# Patient Record
Sex: Female | Born: 1945 | Race: White | Hispanic: No | Marital: Married | Smoking: Former smoker
Health system: Southern US, Community
[De-identification: ages and names within clinical notes are randomized; demographics above are authoritative.]

## PROBLEM LIST (undated history)

## (undated) DIAGNOSIS — E039 Hypothyroidism, unspecified: Secondary | ICD-10-CM

## (undated) DIAGNOSIS — M1990 Unspecified osteoarthritis, unspecified site: Secondary | ICD-10-CM

## (undated) DIAGNOSIS — Z9889 Other specified postprocedural states: Secondary | ICD-10-CM

## (undated) DIAGNOSIS — K219 Gastro-esophageal reflux disease without esophagitis: Secondary | ICD-10-CM

## (undated) DIAGNOSIS — D649 Anemia, unspecified: Secondary | ICD-10-CM

## (undated) DIAGNOSIS — R112 Nausea with vomiting, unspecified: Secondary | ICD-10-CM

## (undated) HISTORY — PX: KNEE ARTHROSCOPY: SUR90

## (undated) HISTORY — PX: JOINT REPLACEMENT: SHX530

---

## 2004-04-06 ENCOUNTER — Ambulatory Visit: Payer: Self-pay | Admitting: Internal Medicine

## 2005-12-30 ENCOUNTER — Ambulatory Visit: Payer: Self-pay | Admitting: Family Medicine

## 2006-01-03 ENCOUNTER — Ambulatory Visit: Payer: Self-pay | Admitting: Family Medicine

## 2006-01-07 ENCOUNTER — Ambulatory Visit: Payer: Self-pay | Admitting: Family Medicine

## 2006-01-12 ENCOUNTER — Ambulatory Visit: Payer: Self-pay | Admitting: Unknown Physician Specialty

## 2006-01-13 ENCOUNTER — Ambulatory Visit: Payer: Self-pay | Admitting: Family Medicine

## 2006-01-14 ENCOUNTER — Ambulatory Visit: Payer: Self-pay | Admitting: Family Medicine

## 2006-07-12 ENCOUNTER — Ambulatory Visit: Payer: Self-pay | Admitting: Family Medicine

## 2007-10-26 ENCOUNTER — Ambulatory Visit: Payer: Self-pay | Admitting: Family Medicine

## 2009-03-04 ENCOUNTER — Ambulatory Visit: Payer: Self-pay | Admitting: Family Medicine

## 2009-08-15 ENCOUNTER — Ambulatory Visit: Payer: Self-pay | Admitting: Unknown Physician Specialty

## 2009-10-15 ENCOUNTER — Encounter: Admission: RE | Admit: 2009-10-15 | Discharge: 2009-10-15 | Payer: Self-pay | Admitting: Sports Medicine

## 2010-05-08 ENCOUNTER — Encounter: Payer: Self-pay | Admitting: Family Medicine

## 2010-05-21 ENCOUNTER — Encounter: Payer: Self-pay | Admitting: Family Medicine

## 2010-07-24 ENCOUNTER — Other Ambulatory Visit (HOSPITAL_COMMUNITY): Payer: Self-pay | Admitting: Orthopedic Surgery

## 2010-07-24 ENCOUNTER — Encounter (HOSPITAL_COMMUNITY): Payer: 59

## 2010-07-24 ENCOUNTER — Ambulatory Visit (HOSPITAL_COMMUNITY)
Admission: RE | Admit: 2010-07-24 | Discharge: 2010-07-24 | Disposition: A | Discharge status: Home / Self Care | Payer: 59 | Source: Ambulatory Visit | Attending: Orthopedic Surgery | Admitting: Orthopedic Surgery

## 2010-07-24 ENCOUNTER — Other Ambulatory Visit: Payer: Self-pay | Admitting: Orthopedic Surgery

## 2010-07-24 DIAGNOSIS — Z01818 Encounter for other preprocedural examination: Secondary | ICD-10-CM | POA: Insufficient documentation

## 2010-07-24 DIAGNOSIS — IMO0002 Reserved for concepts with insufficient information to code with codable children: Secondary | ICD-10-CM | POA: Insufficient documentation

## 2010-07-24 DIAGNOSIS — M1710 Unilateral primary osteoarthritis, unspecified knee: Secondary | ICD-10-CM | POA: Insufficient documentation

## 2010-07-24 DIAGNOSIS — M1712 Unilateral primary osteoarthritis, left knee: Secondary | ICD-10-CM

## 2010-07-29 ENCOUNTER — Other Ambulatory Visit: Payer: Self-pay | Admitting: Orthopedic Surgery

## 2010-07-29 ENCOUNTER — Inpatient Hospital Stay (HOSPITAL_COMMUNITY)
Admission: RE | Admit: 2010-07-29 | Discharge: 2010-07-31 | DRG: 470 | Disposition: A | Discharge status: Skilled Nursing Facility | Payer: 59 | Source: Ambulatory Visit | Attending: Orthopedic Surgery | Admitting: Orthopedic Surgery

## 2010-07-29 ENCOUNTER — Inpatient Hospital Stay (HOSPITAL_COMMUNITY): Payer: 59

## 2010-07-29 DIAGNOSIS — D369 Benign neoplasm, unspecified site: Secondary | ICD-10-CM | POA: Diagnosis present

## 2010-07-29 DIAGNOSIS — M1710 Unilateral primary osteoarthritis, unspecified knee: Principal | ICD-10-CM | POA: Diagnosis present

## 2010-07-29 DIAGNOSIS — M659 Unspecified synovitis and tenosynovitis, unspecified site: Secondary | ICD-10-CM | POA: Diagnosis present

## 2010-07-29 DIAGNOSIS — E039 Hypothyroidism, unspecified: Secondary | ICD-10-CM | POA: Diagnosis present

## 2010-07-29 DIAGNOSIS — M810 Age-related osteoporosis without current pathological fracture: Secondary | ICD-10-CM | POA: Diagnosis present

## 2010-07-29 DIAGNOSIS — G43909 Migraine, unspecified, not intractable, without status migrainosus: Secondary | ICD-10-CM | POA: Diagnosis present

## 2010-07-29 LAB — ABO/RH: ABO/RH(D): A POS

## 2010-07-29 LAB — COMPREHENSIVE METABOLIC PANEL
AST: 17 U/L (ref 0–37)
Alkaline Phosphatase: 36 U/L — ABNORMAL LOW (ref 39–117)
Chloride: 106 mEq/L (ref 96–112)
GFR calc Af Amer: >60 mL/min (ref >60)
GFR calc non Af Amer: >60 mL/min (ref >60)
Potassium: 4.6 mEq/L (ref 3.5–5.1)
Total Bilirubin: 1.1 mg/dL (ref 0.3–1.2)
Total Protein: 6.1 g/dL (ref 6.0–8.3)

## 2010-07-29 LAB — CBC
HCT: 37.8 % (ref 36.0–46.0)
MCHC: 33.9 g/dL (ref 30.0–36.0)
MCV: 91.7 fL (ref 78.0–100.0)
Platelets: 304 10*3/uL (ref 150–400)

## 2010-07-29 LAB — PROTIME-INR: Prothrombin Time: 13 seconds (ref 11.6–15.2)

## 2010-07-29 LAB — APTT: aPTT: 26 seconds (ref 24–37)

## 2010-07-29 LAB — TYPE AND SCREEN: Antibody Screen: NEGATIVE

## 2010-07-29 LAB — SURGICAL PCR SCREEN: Staphylococcus aureus: NEGATIVE

## 2010-07-30 LAB — BASIC METABOLIC PANEL
BUN: 8 mg/dL (ref 6–23)
Calcium: 7.9 mg/dL — ABNORMAL LOW (ref 8.4–10.5)
GFR calc non Af Amer: >60 mL/min (ref >60)
Glucose, Bld: 108 mg/dL — ABNORMAL HIGH (ref 70–99)

## 2010-07-30 LAB — CBC
HCT: 30.6 % — ABNORMAL LOW (ref 36.0–46.0)
Hemoglobin: 10.3 g/dL — ABNORMAL LOW (ref 12.0–15.0)
MCHC: 33.7 g/dL (ref 30.0–36.0)
RBC: 3.32 MIL/uL — ABNORMAL LOW (ref 3.87–5.11)
RDW: 12.9 % (ref 11.5–15.5)

## 2010-07-31 LAB — CBC
Platelets: 207 10*3/uL (ref 150–400)
RBC: 3.28 MIL/uL — ABNORMAL LOW (ref 3.87–5.11)
WBC: 8.3 10*3/uL (ref 4.0–10.5)

## 2010-07-31 LAB — BASIC METABOLIC PANEL
BUN: 5 mg/dL — ABNORMAL LOW (ref 6–23)
CO2: 24 mEq/L (ref 19–32)
Calcium: 8.3 mg/dL — ABNORMAL LOW (ref 8.4–10.5)
Creatinine, Ser: 0.67 mg/dL (ref 0.4–1.2)
GFR calc Af Amer: >60 mL/min (ref >60)
GFR calc non Af Amer: >60 mL/min (ref >60)
Glucose, Bld: 105 mg/dL — ABNORMAL HIGH (ref 70–99)

## 2010-07-31 LAB — PROTIME-INR: Prothrombin Time: 16.9 seconds — ABNORMAL HIGH (ref 11.6–15.2)

## 2010-08-10 NOTE — Op Note (Signed)
NAME:  Valerie Gentry, Valerie Gentry                ACCOUNT NO.:  [id number]  MEDICAL RECORD NO.:  [id number]           PATIENT TYPE:  I  LOCATION:  5039                         FACILITY:  [facility]  PHYSICIAN:  Loreta Ave, M.D. DATE OF BIRTH:  05-Feb-1946  DATE OF PROCEDURE:  07/29/2010 DATE OF DISCHARGE:                              OPERATIVE REPORT   PREOPERATIVE DIAGNOSES: 1. Left knee end-stage degenerative arthritis, varus alignment. 2. Right knee end-stage degenerative arthritis, varus alignment.  POSTOPERATIVE DIAGNOSES: 1. Left knee end-stage degenerative arthritis, varus alignment. 2. Right knee end-stage degenerative arthritis, varus alignment.  PROCEDURE: 1. Left modified minimally invasive total knee replacement.  Soft     tissue balancing.  Stryker triathlon prosthesis.  Cemented pegged     posterior stabilized #5 femoral component.  Cemented #5 tibial     component with 11-mm polyethylene insert.  Cemented resurfacing     medial offset 38-mm patellar component. 2. Intra-articular injection Depo-Medrol and Marcaine, right knee.  SURGEON:  Loreta Ave, MD  ASSISTANT:  Genene Churn. Barry Dienes, PA-C, present throughout the entire case and necessary for timely completion of procedure.  ANESTHESIA:  General  BLOOD LOSS:  Minimal.  SPECIMEN:  None.  CULTURES:  None.  COMPLICATIONS:  None.  DRESSING:  Sterile compressive knee immobilizer.  DRAIN:  Hemovac x1.  TOURNIQUET TIME:  1 hour 10 minutes on the left.  PROCEDURE IN DETAIL:  The patient was brought to the operating room and after adequate anesthesia had been obtained, both knees examined.  She still has full extension, reasonable flexion, both knees.  Varus alignment correctable both sides.  Under sterile technique, intra- articular injection of right knee with Depo-Medrol and Marcaine. Attention was turned to the left.  Tourniquet applied.  Prepped and draped in usual sterile fashion.  Exsanguinated with elevation  of Esmarch, tourniquet inflated to 350 mmHg.  A longitudinal incision above the patella down to tibial tubercle.  Medial arthrotomy, vastus splitting, preserving quad tendon.  Knee exposed.  Grade 4 change throughout.  Remnants of menisci, cruciate ligaments.  Periarticular spurs removed.  Distal femur exposed.  Intramedullary guide placed. Distal cut 10 mm, set at 5 degrees of valgus.  Using epicondylar axis, the femur was sized, cut, and fitted for posterior stabilized pegged #5 femoral component.  Attention was turned to the tibia.  Extramedullary guide.  A 3-degree posterior slope cut.  Resected below the defect medially.  Size #5 component.  Recess examined to be sure all remnants of menisci spurs removed.  Patella exposed, posterior 10 mm removed. Drilled, sized, and fitted for 38-mm patellar component.  Trials put in place throughout.  #5 above and below.  An 11-mm insert and 38 of the patella.  With this construct, good mechanical axis, full extension, full flexion, good correction of deformity.  Excellent patellofemoral tracking.  Tibia was marked for rotation and hand reamed.  All trials have been removed.  Copious irrigation with a pulse irrigating device. Cement prepared and placed on all components, firmly seated. Polyethylene attached to tibia, knee reduced.  Patella held with clamp. Once the cement hardened, the knee was reexamined.  Again, very pleased with alignment, stability, and motion.  Hemovac had been placed through a separate stab wound superolaterally.  Wound irrigated once again. Arthrotomy closed with #1 Vicryl, skin and subcutaneous tissue with Vicryl and staples.  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  Portions of this was sent as a specimen to rule out inflammatory arthropathy.  As a result of that, degree of hypertrophic synovitis, we injected with  Depo-Medrol and Marcaine at completion and the Hemovac clamp.  Sterile compressive dressing applied.  Tourniquet was deflated and removed.  Knee immobilizer was applied.  Anesthesia was reversed.  Brought to the recovery room.  Tolerated the surgery well.  No complications.     Loreta Ave, M.D.     DFM/MEDQ  D:  07/30/2010  T:  07/31/2010  Job:  [id number]  Electronically Signed by Mckinley Jewel M.D. on 08/10/2010 02:58:57 PM

## 2010-08-10 NOTE — Discharge Summary (Signed)
NAME:  Valerie Gentry, Valerie Gentry                ACCOUNT NO.:  [id number]  MEDICAL RECORD NO.:  [id number]           PATIENT TYPE:  I  LOCATION:  5039                         FACILITY:  [facility]  PHYSICIAN:  Loreta Ave, M.D. DATE OF BIRTH:  Jan 24, 1946  DATE OF ADMISSION:  07/29/2010 DATE OF DISCHARGE:  07/31/2010                              DISCHARGE SUMMARY   FINAL DIAGNOSES: 1. Status post left total knee replacement for end-stage degenerative     joint disease. 2. Right knee intra-articular Marcaine/Depo-Medrol injection for     degenerative joint disease and synovitis 3. Migraines. 4. Hypothyroid. 5. Benign neoplasm. 6. Osteoporosis.  HISTORY OF PRESENT ILLNESS:  A 65 year old white female with history of end-stage DJD, left knee and chronic pain presented to our office for preop evaluation for total knee replacement.  She had progressive worsening pain with failure to response with conservative treatment. Significant decrease in her daily activities due to the ongoing complaint.  She has also had complaint of a right knee pain due to DJD and synovitis.  We had discussed performing a right knee intra-articular Marcaine/Depo-Medrol injection at the time of her surgery.  HOSPITAL COURSE:  On July 28, 2010, the patient was taken to the [facility] OR and a left total knee replacement and right knee intra- articular Marcaine/Depo-Medrol injection injections was performed.  SURGEON:  Loreta Ave, MD.  ASSISTANT:  Genene Churn. Barry Dienes, PA-C.  ANESTHESIA:  General.  Synovial tissue, left knee, sent for specimen.  BLOOD LOSS:  Minimal.  TOURNIQUET TIME:  70 minutes.  DRAIN:  One Hemovac drain used and the patient was given transfusion while in recovery.  There were no surgical or anesthesia complications and the patient was transferred to recovery in stable condition.  On July 30, 2010, the patient doing great without complaints. Pharmacy protocol, Coumadin and Lovenox  are for DVT prophylaxis.  PT, OT consults.  Temperature 98.8, pulse 87, respirations 18, blood pressure 113/64.  Dressing clean, dry, and intact.  Calf nontender. Neurovascularly intact.  Skin:  Warm and dry.  Awaiting transfer to [facility].  On July 31, 2010, the patient doing great and ready for transfer.  Temperature 98.2, pulse 96, respirations 18, blood pressure 130/97.  WBC 8.3, hemoglobin 10.1, hematocrit 30.1, platelets 207.  Sodium 136, potassium 3.7, chloride 106, CO2 24, BUN 5, creatinine 0.67, glucose 105, INR 1.35.  Knee wound looks good and staples intact. No drainage or sign of infection.  Hemovac drain removed.  Calf nontender.  Neurovascularly intact.  DISCHARGE MEDICATIONS: 1. Percocet 7.5/325 one-two tablets p.o. q. 4-6 hours p.r.n. for pain. 2. Robaxin 500 mg 1 tablet p.o. q.6 h p.r.n. for spasms. 3. Lovenox 30 mg one subcu injection q.12 h and discontinue when     Coumadin is therapeutic with INR 2-3. 4. Coumadin pharmacy protocol.  Maintain INR 2-3 x4 weeks postop for     DVT prophylaxis. 5. Cetirizine/Pseudoephedrine 1 tablet p.o. q.12 h p.r.n. 6. Synthroid 125 mcg 1 tablet p.o. daily. 7. Artificial tears both eyes 1 drop daily as needed.  DISPOSITION:  Transfer to [facility].  CONDITION:  Good  and stable.  INSTRUCTIONS:  While at the facility, the patient will work with PT/OT to improve ambulation and knee range of motion and strengthening. Weightbear as tolerated.  Can wean off walker to a single-point cane as comfortable.  CPM started at 0-75 degrees, then increase 10 degrees daily.  Daily dressing changes with 4x4 gauze and tape.  She is okay to shower, but no tub soaking.  Do not apply any creams or ointments to her incision.  Coumadin x4 weeks postop for DVT prophylaxis.  Maintain INR 2- 3.  Discontinue Lovenox when Coumadin is therapeutic.  Knee staples to be removed 2 weeks postop and this can be done at our office at 2-week postop  appointment.  Call with any questions, concerns.     Genene Churn. Denton Meek.   ______________________________ Loreta Ave, M.D.    JMO/MEDQ  D:  07/31/2010  T:  07/31/2010  Job:  [id number]  Electronically Signed by Zonia Kief P.A. on 08/07/2010 04:05:19 PM Electronically Signed by Mckinley Jewel M.D. on 08/10/2010 02:58:59 PM

## 2011-03-16 ENCOUNTER — Ambulatory Visit: Payer: Self-pay | Admitting: Family Medicine

## 2011-07-20 ENCOUNTER — Encounter (HOSPITAL_COMMUNITY): Payer: Self-pay | Admitting: Pharmacy Technician

## 2011-07-28 ENCOUNTER — Encounter (HOSPITAL_COMMUNITY)
Admission: RE | Admit: 2011-07-28 | Discharge: 2011-07-28 | Disposition: A | Discharge status: Home / Self Care | Payer: 59 | Source: Ambulatory Visit | Attending: Orthopedic Surgery | Admitting: Orthopedic Surgery

## 2011-07-28 ENCOUNTER — Encounter (HOSPITAL_COMMUNITY)
Admission: RE | Admit: 2011-07-28 | Discharge: 2011-07-28 | Disposition: A | Discharge status: Home / Self Care | Payer: 59 | Source: Ambulatory Visit | Attending: Surgery | Admitting: Surgery

## 2011-07-28 ENCOUNTER — Other Ambulatory Visit: Payer: Self-pay

## 2011-07-28 ENCOUNTER — Encounter (HOSPITAL_COMMUNITY): Payer: Self-pay

## 2011-07-28 HISTORY — DX: Unspecified osteoarthritis, unspecified site: M19.90

## 2011-07-28 HISTORY — DX: Hypothyroidism, unspecified: E03.9

## 2011-07-28 HISTORY — DX: Nausea with vomiting, unspecified: R11.2

## 2011-07-28 HISTORY — DX: Gastro-esophageal reflux disease without esophagitis: K21.9

## 2011-07-28 HISTORY — DX: Anemia, unspecified: D64.9

## 2011-07-28 HISTORY — DX: Other specified postprocedural states: Z98.890

## 2011-07-28 LAB — CBC
Hemoglobin: 12.5 g/dL (ref 12.0–15.0)
MCHC: 34.2 g/dL (ref 30.0–36.0)
RDW: 13 % (ref 11.5–15.5)
WBC: 6.1 10*3/uL (ref 4.0–10.5)

## 2011-07-28 LAB — COMPREHENSIVE METABOLIC PANEL
ALT: 11 U/L (ref 0–35)
Albumin: 3.7 g/dL (ref 3.5–5.2)
Alkaline Phosphatase: 42 U/L (ref 39–117)
Chloride: 105 mEq/L (ref 96–112)
Potassium: 4 mEq/L (ref 3.5–5.1)
Sodium: 139 mEq/L (ref 135–145)
Total Protein: 6.7 g/dL (ref 6.0–8.3)

## 2011-07-28 LAB — URINALYSIS, ROUTINE W REFLEX MICROSCOPIC
Glucose, UA: NEGATIVE mg/dL
Hgb urine dipstick: NEGATIVE
Specific Gravity, Urine: 1.021 (ref 1.005–1.030)
pH: 8 (ref 5.0–8.0)

## 2011-07-28 LAB — APTT: aPTT: 28 seconds (ref 24–37)

## 2011-07-28 LAB — SURGICAL PCR SCREEN
MRSA, PCR: NEGATIVE
Staphylococcus aureus: NEGATIVE

## 2011-07-28 NOTE — Pre-Procedure Instructions (Signed)
20 Valerie Gentry  07/28/2011   Your procedure is scheduled on: 08-04-2011 8:30 AM Report to [facility] at 6:30 AM.  Call this number if you have problems the morning of surgery: [phone]   Remember:   Do not eat food:After Midnight.  May have clear liquids: up to 4 Hours before arrival.  Clear liquids include soda, tea, black coffee, apple or grape juice, broth.Until 2:30 AM  Take these medicines the morning of surgery with A SIP OF WATER valium,levothyroxine,estradiol.tramadol  Do not wear jewelry, make-up or nail polish.  Do not wear lotions, powders, or perfumes. You may wear deodorant.  Do not shave 48 hours prior to surgery.  Do not bring valuables to the hospital.  Contacts, dentures or bridgework may not be worn into surgery.  Leave suitcase in the car. After surgery it may be brought to your room.  For patients admitted to the hospital, checkout time is 11:00 AM the day of discharge.   Marland Kitchen    Special Instructions: Incentive Spirometry - Practice and bring it with you on the day of surgery. and CHG Shower Use Special Wash: 1/2 bottle night before surgery and 1/2 bottle morning of surgery.   Please read over the following fact sheets that you were given: Blood Transfusion Information, MRSA Information and Surgical Site Infection Prevention

## 2011-07-28 NOTE — H&P (Signed)
  [facility] [address]   [address] [place], [place] [address] [phone] A [facility]  Loreta Ave, M.D.     Robert A. Thurston Hole, M.D.     Lunette Stands, M.D. Eulas Post, M.D.    Buford Dresser, M.D. Estell Harpin, M.D. Ralene Cork, D.O.          Genene Churn. Barry Dienes, PA-C            Kirstin A. Shepperson, PA-C Stevenson, OPA-C   RE: Valerie, Gentry   [id number]      DOB: 1945-12-28 PROGRESS NOTE: 07-23-11 Chief complaint: right knee pain.  History of present illness: 66 year old white female with history of end stage degenerative joint disease and chronic pain returns. Symptoms are unchanged from previous visit. She is wanting to proceed with total knee replacement as scheduled.  Current medications: Lunesta valium Synthroid Fem HRT Celebrex Tramadol simvatritine. Drug allergies: penicillin. Past medical/surgical history left total knee replacement osteoporosis benign bowel neoplasm anemia breast lump hemorrhoids migraines pleurisy hypothyroid dysphasia.  Family history: not listed. Social history: she's married employed with [employer]. Admits occasional alcohol use. Denies smoking. Review of systems: no fever chills GI GU cardiac or pulmonary issues.  EXAMINATION: Height 5'5" weight 200 pounds. Blood pressure 120/75 pulse 84. Alert and oriented x3 in no acute distress. Head normal cephalic atraumatic. PERRLA Extraocular motion is intact. Cervical spine unremarkable. Lungs CTA bilaterally no wheezes noted. Heart regular rate and rhythm. S1 S2. No murmurs. Abdomen round non-distended. NBSx4 soft non-tender. Right knee decreased range of motion. Positive crepitus. Joint line tenderness. 1 to 2+ effusion. Ligaments stable. Bilateral calves non-tender neurovascularly intact. Skin warm and dry.   IMPRESSION: Right knee end stage degenerative joint disease with chronic pain. Failed conservative  treatment.  DISPOSITION: We'll proceed with right total knee replacement as scheduled. Discussed risks benefits and possible complications in detail. All questions answered.  Loreta Ave, M.D.  Electronically verified by Loreta Ave, M.D. DFM(JMO):kh D 07-26-11 T 07-26-11

## 2011-07-28 NOTE — Progress Notes (Signed)
Pt. 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

## 2011-08-03 MED ORDER — VANCOMYCIN HCL IN DEXTROSE 1-5 GM/200ML-% IV SOLN
1000.0000 mg | INTRAVENOUS | Status: AC
  Administered 2011-08-04: 1000 mg via INTRAVENOUS
  Filled 2011-08-03: qty 200

## 2011-08-04 ENCOUNTER — Encounter (HOSPITAL_COMMUNITY): Payer: Self-pay | Admitting: Certified Registered"

## 2011-08-04 ENCOUNTER — Encounter (HOSPITAL_COMMUNITY): Payer: Self-pay | Admitting: *Deleted

## 2011-08-04 ENCOUNTER — Ambulatory Visit (HOSPITAL_COMMUNITY): Payer: 59 | Admitting: Certified Registered"

## 2011-08-04 ENCOUNTER — Ambulatory Visit (HOSPITAL_COMMUNITY): Payer: 59

## 2011-08-04 ENCOUNTER — Inpatient Hospital Stay (HOSPITAL_COMMUNITY)
Admission: RE | Admit: 2011-08-04 | Discharge: 2011-08-07 | DRG: 470 | Disposition: A | Discharge status: Skilled Nursing Facility | Payer: 59 | Source: Ambulatory Visit | Attending: Orthopedic Surgery | Admitting: Orthopedic Surgery

## 2011-08-04 ENCOUNTER — Encounter (HOSPITAL_COMMUNITY)
Admission: RE | Disposition: A | Discharge status: Skilled Nursing Facility | Payer: Self-pay | Source: Ambulatory Visit | Attending: Orthopedic Surgery

## 2011-08-04 DIAGNOSIS — Z01810 Encounter for preprocedural cardiovascular examination: Secondary | ICD-10-CM

## 2011-08-04 DIAGNOSIS — M1710 Unilateral primary osteoarthritis, unspecified knee: Principal | ICD-10-CM | POA: Diagnosis present

## 2011-08-04 DIAGNOSIS — G8929 Other chronic pain: Secondary | ICD-10-CM | POA: Diagnosis present

## 2011-08-04 DIAGNOSIS — Z01818 Encounter for other preprocedural examination: Secondary | ICD-10-CM

## 2011-08-04 DIAGNOSIS — Z01812 Encounter for preprocedural laboratory examination: Secondary | ICD-10-CM

## 2011-08-04 DIAGNOSIS — Z7901 Long term (current) use of anticoagulants: Secondary | ICD-10-CM

## 2011-08-04 DIAGNOSIS — Z471 Aftercare following joint replacement surgery: Secondary | ICD-10-CM

## 2011-08-04 DIAGNOSIS — M810 Age-related osteoporosis without current pathological fracture: Secondary | ICD-10-CM | POA: Diagnosis present

## 2011-08-04 DIAGNOSIS — D62 Acute posthemorrhagic anemia: Secondary | ICD-10-CM | POA: Diagnosis not present

## 2011-08-04 DIAGNOSIS — Z880 Allergy status to penicillin: Secondary | ICD-10-CM

## 2011-08-04 DIAGNOSIS — E039 Hypothyroidism, unspecified: Secondary | ICD-10-CM | POA: Diagnosis present

## 2011-08-04 DIAGNOSIS — G43909 Migraine, unspecified, not intractable, without status migrainosus: Secondary | ICD-10-CM | POA: Diagnosis present

## 2011-08-04 DIAGNOSIS — Z79899 Other long term (current) drug therapy: Secondary | ICD-10-CM

## 2011-08-04 HISTORY — PX: TOTAL KNEE ARTHROPLASTY: SHX125

## 2011-08-04 LAB — TYPE AND SCREEN
ABO/RH(D): A POS
Antibody Screen: NEGATIVE

## 2011-08-04 SURGERY — ARTHROPLASTY, KNEE, TOTAL
Anesthesia: Regional | Laterality: Right | Wound class: Clean

## 2011-08-04 MED ORDER — SODIUM CHLORIDE 0.9 % IR SOLN
Status: DC | PRN
  Administered 2011-08-04: 3000 mL

## 2011-08-04 MED ORDER — POTASSIUM CHLORIDE IN NACL 20-0.9 MEQ/L-% IV SOLN
INTRAVENOUS | Status: DC
  Administered 2011-08-04: 18:00:00 via INTRAVENOUS
  Filled 2011-08-04 (×4): qty 1000

## 2011-08-04 MED ORDER — ENOXAPARIN SODIUM 30 MG/0.3ML ~~LOC~~ SOLN
30.0000 mg | Freq: Two times a day (BID) | SUBCUTANEOUS | Status: DC
  Administered 2011-08-04 – 2011-08-07 (×6): 30 mg via SUBCUTANEOUS
  Filled 2011-08-04 (×7): qty 0.3

## 2011-08-04 MED ORDER — ONDANSETRON HCL 4 MG/2ML IJ SOLN: 4.0000 mg | Freq: Four times a day (QID) | INTRAMUSCULAR | Status: DC | PRN

## 2011-08-04 MED ORDER — DEXAMETHASONE SODIUM PHOSPHATE 4 MG/ML IJ SOLN
INTRAMUSCULAR | Status: DC | PRN
  Administered 2011-08-04: 4 mg via INTRAVENOUS

## 2011-08-04 MED ORDER — DEXTROSE 5 % IV SOLN
500.0000 mg | Freq: Four times a day (QID) | INTRAVENOUS | Status: DC | PRN
  Filled 2011-08-04: qty 5

## 2011-08-04 MED ORDER — MORPHINE SULFATE 4 MG/ML IJ SOLN
INTRAMUSCULAR | Status: DC | PRN
  Administered 2011-08-04: 4 mg via INTRAVENOUS

## 2011-08-04 MED ORDER — HYDROMORPHONE HCL PF 1 MG/ML IJ SOLN
0.2500 mg | INTRAMUSCULAR | Status: DC | PRN
  Administered 2011-08-04 (×4): 0.5 mg via INTRAVENOUS

## 2011-08-04 MED ORDER — DEXTROSE 5 % IV SOLN
INTRAVENOUS | Status: DC | PRN
  Administered 2011-08-04: 09:00:00 via INTRAVENOUS

## 2011-08-04 MED ORDER — DROPERIDOL 2.5 MG/ML IJ SOLN
INTRAMUSCULAR | Status: DC | PRN
  Administered 2011-08-04: 0.625 mg via INTRAVENOUS

## 2011-08-04 MED ORDER — ACETAMINOPHEN 325 MG PO TABS: 650.0000 mg | ORAL_TABLET | Freq: Four times a day (QID) | ORAL | Status: DC | PRN

## 2011-08-04 MED ORDER — LIDOCAINE HCL 4 % MT SOLN
OROMUCOSAL | Status: DC | PRN
  Administered 2011-08-04: 4 mL via TOPICAL

## 2011-08-04 MED ORDER — COUMADIN BOOK
Freq: Once | Status: AC
  Administered 2011-08-04: 16:00:00
  Filled 2011-08-04: qty 1

## 2011-08-04 MED ORDER — MIDAZOLAM HCL 5 MG/5ML IJ SOLN
INTRAMUSCULAR | Status: DC | PRN
  Administered 2011-08-04: 2 mg via INTRAVENOUS

## 2011-08-04 MED ORDER — VANCOMYCIN HCL IN DEXTROSE 1-5 GM/200ML-% IV SOLN
1000.0000 mg | Freq: Two times a day (BID) | INTRAVENOUS | Status: AC
  Administered 2011-08-04 – 2011-08-05 (×2): 1000 mg via INTRAVENOUS
  Filled 2011-08-04 (×2): qty 200

## 2011-08-04 MED ORDER — OXYCODONE-ACETAMINOPHEN 5-325 MG PO TABS
1.0000 | ORAL_TABLET | ORAL | Status: DC | PRN
  Administered 2011-08-05: 2 via ORAL
  Administered 2011-08-05 (×3): 1 via ORAL
  Administered 2011-08-05 – 2011-08-06 (×2): 2 via ORAL
  Administered 2011-08-06: 1 via ORAL
  Administered 2011-08-06 – 2011-08-07 (×4): 2 via ORAL
  Filled 2011-08-04: qty 1
  Filled 2011-08-04 (×3): qty 2
  Filled 2011-08-04 (×2): qty 1
  Filled 2011-08-04 (×4): qty 2
  Filled 2011-08-04: qty 1

## 2011-08-04 MED ORDER — DIPHENHYDRAMINE HCL 12.5 MG/5ML PO ELIX
12.5000 mg | ORAL_SOLUTION | Freq: Four times a day (QID) | ORAL | Status: DC | PRN
  Filled 2011-08-04: qty 5

## 2011-08-04 MED ORDER — PROPOFOL 10 MG/ML IV EMUL
INTRAVENOUS | Status: DC | PRN
  Administered 2011-08-04: 150 mg via INTRAVENOUS

## 2011-08-04 MED ORDER — MORPHINE SULFATE (PF) 1 MG/ML IV SOLN
INTRAVENOUS | Status: DC
  Administered 2011-08-04: 1 mg via INTRAVENOUS
  Administered 2011-08-05: 4 mg via INTRAVENOUS

## 2011-08-04 MED ORDER — WARFARIN VIDEO: Freq: Once | Status: DC

## 2011-08-04 MED ORDER — GLYCOPYRROLATE 0.2 MG/ML IJ SOLN
INTRAMUSCULAR | Status: DC | PRN
  Administered 2011-08-04: .4 mg via INTRAVENOUS

## 2011-08-04 MED ORDER — BUPIVACAINE HCL (PF) 0.25 % IJ SOLN
INTRAMUSCULAR | Status: DC | PRN
  Administered 2011-08-04: 30 mL

## 2011-08-04 MED ORDER — FLEET ENEMA 7-19 GM/118ML RE ENEM: 1.0000 | ENEMA | Freq: Once | RECTAL | Status: AC | PRN

## 2011-08-04 MED ORDER — SUFENTANIL CITRATE 50 MCG/ML IV SOLN
INTRAVENOUS | Status: DC | PRN
  Administered 2011-08-04 (×5): 10 ug via INTRAVENOUS

## 2011-08-04 MED ORDER — LACTATED RINGERS IV SOLN
INTRAVENOUS | Status: DC | PRN
  Administered 2011-08-04 (×2): via INTRAVENOUS

## 2011-08-04 MED ORDER — ROCURONIUM BROMIDE 100 MG/10ML IV SOLN
INTRAVENOUS | Status: DC | PRN
  Administered 2011-08-04: 50 mg via INTRAVENOUS

## 2011-08-04 MED ORDER — DOCUSATE SODIUM 100 MG PO CAPS
100.0000 mg | ORAL_CAPSULE | Freq: Two times a day (BID) | ORAL | Status: DC
  Administered 2011-08-04 – 2011-08-07 (×6): 100 mg via ORAL
  Filled 2011-08-04 (×7): qty 1

## 2011-08-04 MED ORDER — NALOXONE HCL 0.4 MG/ML IJ SOLN: 0.4000 mg | INTRAMUSCULAR | Status: DC | PRN

## 2011-08-04 MED ORDER — METHOCARBAMOL 500 MG PO TABS
500.0000 mg | ORAL_TABLET | Freq: Four times a day (QID) | ORAL | Status: DC | PRN
  Administered 2011-08-05 – 2011-08-07 (×3): 500 mg via ORAL
  Filled 2011-08-04 (×3): qty 1

## 2011-08-04 MED ORDER — ACETAMINOPHEN 650 MG RE SUPP: 650.0000 mg | Freq: Four times a day (QID) | RECTAL | Status: DC | PRN

## 2011-08-04 MED ORDER — SCOPOLAMINE 1 MG/3DAYS TD PT72
MEDICATED_PATCH | TRANSDERMAL | Status: DC | PRN
  Administered 2011-08-04: 1 via TRANSDERMAL

## 2011-08-04 MED ORDER — ONDANSETRON HCL 4 MG PO TABS
4.0000 mg | ORAL_TABLET | Freq: Four times a day (QID) | ORAL | Status: DC | PRN
  Administered 2011-08-07: 4 mg via ORAL
  Filled 2011-08-04: qty 1

## 2011-08-04 MED ORDER — SODIUM CHLORIDE 0.9 % IJ SOLN: 9.0000 mL | INTRAMUSCULAR | Status: DC | PRN

## 2011-08-04 MED ORDER — PHENOL 1.4 % MT LIQD
1.0000 | OROMUCOSAL | Status: DC | PRN
  Filled 2011-08-04: qty 177

## 2011-08-04 MED ORDER — WARFARIN SODIUM 7.5 MG PO TABS
7.5000 mg | ORAL_TABLET | Freq: Once | ORAL | Status: AC
  Administered 2011-08-04: 7.5 mg via ORAL
  Filled 2011-08-04: qty 1

## 2011-08-04 MED ORDER — DIAZEPAM 5 MG PO TABS: 5.0000 mg | ORAL_TABLET | Freq: Two times a day (BID) | ORAL | Status: DC | PRN

## 2011-08-04 MED ORDER — NEOSTIGMINE METHYLSULFATE 1 MG/ML IJ SOLN
INTRAMUSCULAR | Status: DC | PRN
  Administered 2011-08-04: 3 mg via INTRAVENOUS

## 2011-08-04 MED ORDER — NORETHINDRONE-ETH ESTRADIOL 1-5 MG-MCG PO TABS
1.0000 | ORAL_TABLET | Freq: Every day | ORAL | Status: DC
  Administered 2011-08-05: 1 via ORAL

## 2011-08-04 MED ORDER — DIPHENHYDRAMINE HCL 50 MG/ML IJ SOLN: 12.5000 mg | Freq: Four times a day (QID) | INTRAMUSCULAR | Status: DC | PRN

## 2011-08-04 MED ORDER — LEVOTHYROXINE SODIUM 125 MCG PO TABS
125.0000 ug | ORAL_TABLET | Freq: Every day | ORAL | Status: DC
  Administered 2011-08-05 – 2011-08-07 (×3): 125 ug via ORAL
  Filled 2011-08-04 (×4): qty 1

## 2011-08-04 MED ORDER — SUMATRIPTAN SUCCINATE 50 MG PO TABS
50.0000 mg | ORAL_TABLET | ORAL | Status: DC | PRN
  Filled 2011-08-04: qty 1

## 2011-08-04 MED ORDER — MENTHOL 3 MG MT LOZG: 1.0000 | LOZENGE | OROMUCOSAL | Status: DC | PRN

## 2011-08-04 MED ORDER — ONDANSETRON HCL 4 MG/2ML IJ SOLN
INTRAMUSCULAR | Status: DC | PRN
  Administered 2011-08-04: 4 mg via INTRAVENOUS

## 2011-08-04 SURGICAL SUPPLY — 55 items
BANDAGE ESMARK 6X9 LF (GAUZE/BANDAGES/DRESSINGS) ×1 IMPLANT
BLADE SAG 18X100X1.27 (BLADE) ×4 IMPLANT
BNDG ESMARK 6X9 LF (GAUZE/BANDAGES/DRESSINGS) ×2
BOOTCOVER CLEANROOM LRG (PROTECTIVE WEAR) ×4 IMPLANT
BOWL SMART MIX CTS (DISPOSABLE) ×2 IMPLANT
CEMENT BONE SIMPLEX SPEEDSET (Cement) ×4 IMPLANT
CLOTH BEACON ORANGE TIMEOUT ST (SAFETY) ×2 IMPLANT
COVER BACK TABLE 24X17X13 BIG (DRAPES) ×2 IMPLANT
COVER SURGICAL LIGHT HANDLE (MISCELLANEOUS) ×2 IMPLANT
CUFF TOURNIQUET SINGLE 34IN LL (TOURNIQUET CUFF) ×2 IMPLANT
DRAPE EXTREMITY T 121X128X90 (DRAPE) ×2 IMPLANT
DRAPE PROXIMA HALF (DRAPES) ×2 IMPLANT
DRAPE U-SHAPE 47X51 STRL (DRAPES) ×2 IMPLANT
DRSG PAD ABDOMINAL 8X10 ST (GAUZE/BANDAGES/DRESSINGS) ×2 IMPLANT
DURAPREP 26ML APPLICATOR (WOUND CARE) ×2 IMPLANT
ELECT CAUTERY BLADE 6.4 (BLADE) ×2 IMPLANT
ELECT REM PT RETURN 9FT ADLT (ELECTROSURGICAL) ×2
ELECTRODE REM PT RTRN 9FT ADLT (ELECTROSURGICAL) ×1 IMPLANT
EVACUATOR 1/8 PVC DRAIN (DRAIN) ×2 IMPLANT
FACESHIELD LNG OPTICON STERILE (SAFETY) ×2 IMPLANT
GAUZE SPONGE 4X4 12PLY STRL LF (GAUZE/BANDAGES/DRESSINGS) ×2 IMPLANT
GAUZE XEROFORM 5X9 LF (GAUZE/BANDAGES/DRESSINGS) ×2 IMPLANT
GLOVE BIOGEL PI IND STRL 8 (GLOVE) ×1 IMPLANT
GLOVE BIOGEL PI INDICATOR 8 (GLOVE) ×1
GOWN PREVENTION PLUS XLARGE (GOWN DISPOSABLE) ×4 IMPLANT
GOWN STRL NON-REIN LRG LVL3 (GOWN DISPOSABLE) ×4 IMPLANT
GOWN STRL REIN 2XL XLG LVL4 (GOWN DISPOSABLE) ×2 IMPLANT
HANDPIECE INTERPULSE COAX TIP (DISPOSABLE) ×1
IMMOBILIZER KNEE 22 UNIV (SOFTGOODS) ×2 IMPLANT
IMMOBILIZER KNEE 24 THIGH 36 (MISCELLANEOUS) IMPLANT
IMMOBILIZER KNEE 24 UNIV (MISCELLANEOUS)
KIT BASIN OR (CUSTOM PROCEDURE TRAY) ×2 IMPLANT
KIT ROOM TURNOVER OR (KITS) ×2 IMPLANT
MANIFOLD NEPTUNE II (INSTRUMENTS) ×2 IMPLANT
NS IRRIG 1000ML POUR BTL (IV SOLUTION) ×2 IMPLANT
PACK TOTAL JOINT (CUSTOM PROCEDURE TRAY) ×2 IMPLANT
PAD ARMBOARD 7.5X6 YLW CONV (MISCELLANEOUS) ×4 IMPLANT
PAD CAST 4YDX4 CTTN HI CHSV (CAST SUPPLIES) ×1 IMPLANT
PADDING CAST COTTON 4X4 STRL (CAST SUPPLIES) ×1
PADDING CAST COTTON 6X4 STRL (CAST SUPPLIES) ×2 IMPLANT
RUBBERBAND STERILE (MISCELLANEOUS) ×2 IMPLANT
SET HNDPC FAN SPRY TIP SCT (DISPOSABLE) ×1 IMPLANT
SPONGE GAUZE 4X4 12PLY (GAUZE/BANDAGES/DRESSINGS) ×2 IMPLANT
STAPLER VISISTAT 35W (STAPLE) ×2 IMPLANT
SUCTION FRAZIER TIP 10 FR DISP (SUCTIONS) ×2 IMPLANT
SUT VIC AB 1 CTX 36 (SUTURE) ×2
SUT VIC AB 1 CTX36XBRD ANBCTR (SUTURE) ×2 IMPLANT
SUT VIC AB 2-0 CT1 27 (SUTURE) ×2
SUT VIC AB 2-0 CT1 TAPERPNT 27 (SUTURE) ×2 IMPLANT
SYR 30ML LL (SYRINGE) ×2 IMPLANT
SYR 30ML SLIP (SYRINGE) ×2 IMPLANT
TOWEL OR 17X24 6PK STRL BLUE (TOWEL DISPOSABLE) ×2 IMPLANT
TOWEL OR 17X26 10 PK STRL BLUE (TOWEL DISPOSABLE) ×2 IMPLANT
TRAY FOLEY CATH 14FR (SET/KITS/TRAYS/PACK) ×2 IMPLANT
WATER STERILE IRR 1000ML POUR (IV SOLUTION) ×6 IMPLANT

## 2011-08-04 NOTE — Brief Op Note (Signed)
08/04/2011  10:46 AM  PATIENT:  Valerie Gentry  66 y.o. female  PRE-OPERATIVE DIAGNOSIS:  DJD RIGHT KNEE  POST-OPERATIVE DIAGNOSIS:  DJD RIGHT KNEE  PROCEDURE:  Procedure(s) (LRB): TOTAL KNEE ARTHROPLASTY (Right)  SURGEON:  Surgeon(s) and Role:    * Loreta Ave, MD - Primary  PHYSICIAN ASSISTANT: Zonia Kief M   ANESTHESIA:   general  EBL:  Total I/O In: 1500 [I.V.:1500] Out: 100 [Urine:100]   SPECIMEN:  No Specimen  DISPOSITION OF SPECIMEN:  N/A  TOURNIQUET:   Total Tourniquet Time Documented: Thigh (Right) - 81 minutes  PATIENT DISPOSITION:  PACU - hemodynamically stable.

## 2011-08-04 NOTE — Anesthesia Procedure Notes (Signed)
Anesthesia Regional Block:  Femoral nerve block  Pre-Anesthetic Checklist: ,, timeout performed, Correct Patient, Correct Site, Correct Laterality, Correct Procedure,, site marked, risks and benefits discussed, Surgical consent,  Pre-op evaluation,  At surgeon's request and post-op pain management  Laterality: Right  Prep: chloraprep       Needles:  Injection technique: Single-shot  Needle Type: Echogenic Stimulator Needle     Needle Length: 5cm 5 cm Needle Gauge: 22 and 22 G    Additional Needles:  Procedures: ultrasound guided and nerve stimulator Femoral nerve block  Nerve Stimulator or Paresthesia:  Response: quadraceps contraction, 0.45 mA,   Additional Responses:   Narrative:  Start time: 08/04/2011 7:53 AM End time: 08/04/2011 8:06 AM Injection made incrementally with aspirations every 5 mL.  Performed by: Personally  Anesthesiologist: Halford Decamp, MD  Additional Notes: Functioning IV was confirmed and monitors were applied.  A 50mm 22ga Arrow echogenic stimulator needle was used. Sterile prep and drape,hand hygiene and sterile gloves were used. Ultrasound guidance: relevent anatomy identified, needle position confirmed, local anesthetic spread visualized around nerve(s)., vascular puncture avoided.  Image printed for medical record. Negative aspiration and negative test dose prior to incremental administration of local anesthetic. The patient tolerated the procedure well.    Femoral nerve block

## 2011-08-04 NOTE — Transfer of Care (Signed)
Immediate Anesthesia Transfer of Care Note  Patient: Valerie Gentry  Procedure(s) Performed: Procedure(s) (LRB): TOTAL KNEE ARTHROPLASTY (Right)  Patient Location: PACU  Anesthesia Type: General and GA combined with regional for post-op pain  Level of Consciousness: awake, alert , oriented, patient cooperative and responds to stimulation  Airway & Oxygen Therapy: Patient Spontanous Breathing and Patient connected to nasal cannula oxygen  Post-op Assessment: Report given to PACU RN, Post -op Vital signs reviewed and stable and Patient moving all extremities  Post vital signs: Reviewed and stable  Complications: No apparent anesthesia complications

## 2011-08-04 NOTE — Progress Notes (Signed)
Orthopedic Tech Progress Note Patient Details:  Valerie Gentry 1945-12-14 [id number]  CPM Right Knee CPM Right Knee: On Right Knee Flexion (Degrees): 0  Right Knee Extension (Degrees): 60    applied overhead frame    Jennye Moccasin 08/04/2011, 4:49 PM

## 2011-08-04 NOTE — Anesthesia Preprocedure Evaluation (Addendum)
Anesthesia Evaluation  Patient identified by MRN, date of birth, ID band Patient awake    Reviewed: Allergy & Precautions, H&P , NPO status , Patient's Chart, lab work & pertinent test results  History of Anesthesia Complications (+) PONV  Airway Mallampati: II  Neck ROM: full    Dental   Pulmonary former smoker clear to auscultation  Pulmonary exam normal       Cardiovascular Regular Normal    Neuro/Psych    GI/Hepatic GERD-  ,  Endo/Other  Hypothyroidism   Renal/GU      Musculoskeletal  (+) Arthritis -,   Abdominal   Peds  Hematology   Anesthesia Other Findings   Reproductive/Obstetrics                          Anesthesia Physical Anesthesia Plan  ASA: II  Anesthesia Plan: General and Regional   Post-op Pain Management: MAC Combined w/ Regional for Post-op pain   Induction: Intravenous  Airway Management Planned: Oral ETT  Additional Equipment:   Intra-op Plan:   Post-operative Plan: Extubation in OR  Informed Consent: I have reviewed the patients History and Physical, chart, labs and discussed the procedure including the risks, benefits and alternatives for the proposed anesthesia with the patient or authorized representative who has indicated his/her understanding and acceptance.     Plan Discussed with: CRNA and Surgeon  Anesthesia Plan Comments:         Anesthesia Quick Evaluation

## 2011-08-04 NOTE — Progress Notes (Signed)
ANTICOAGULATION CONSULT NOTE - Initial Consult  Pharmacy Consult for Coumadin Indication: VTE prophylaxis  Allergies  Allergen Reactions  . Ampicillin Rash    Patient Measurements: Height: 5\' 5"  (165.1 cm) (from 07/27/11 preadmit) Weight: 199 lb 4.7 oz (90.399 kg) (from 07/28/11 preadmit) IBW/kg (Calculated) : 57    Vital Signs: Temp: 99.3 F (37.4 C) (02/13 1333) Temp src: Oral (02/13 0652) BP: 114/68 mmHg (02/13 1329) Pulse Rate: 68  (02/13 1330)  Preadmisson labs 07/28/11 PTT 28, PT 13.4, INR 1.00 Hgb 12.5,  Hct 36.5, PLTC 322 WBC 6.1 Scr 0.69 No results found for this basename: HGB:2,HCT:3,PLT:3,APTT:3,LABPROT:3,INR:3,HEPARINUNFRC:3,CREATININE:3,CKTOTAL:3,CKMB:3,TROPONINI:3 in the last 72 hours Estimated Creatinine Clearance: 80 ml/min (by C-G formula based on Cr of 0.69).  Medical History: Past Medical History  Diagnosis Date  . PONV (postoperative nausea and vomiting)   . Hypothyroidism   . GERD (gastroesophageal reflux disease)   . Arthritis   . Anemia     Medications:  Prescriptions prior to admission  Medication Sig Dispense Refill  . celecoxib (CELEBREX) 200 MG capsule Take 200 mg by mouth 2 (two) times daily.      . Cholecalciferol (VITAMIN D-3) 5000 UNITS TABS Take 1 tablet by mouth daily.      . diazepam (VALIUM) 5 MG tablet Take 5 mg by mouth every 12 (twelve) hours as needed. For anxiety      . Eszopiclone (ESZOPICLONE) 3 MG TABS Take 3 mg by mouth at bedtime as needed. Take immediately before bedtime for sleep      . levothyroxine (SYNTHROID, LEVOTHROID) 125 MCG tablet Take 125 mcg by mouth daily.      . norethindrone-ethinyl estradiol (FEMHRT 1/5) 1-5 MG-MCG TABS Take 1 tablet by mouth daily.      . SUMAtriptan (IMITREX) 50 MG tablet Take 50 mg by mouth every 2 (two) hours as needed. For migrane headache      . traMADol (ULTRAM) 50 MG tablet Take 100 mg by mouth every 8 (eight) hours as needed. For pain       Scheduled:    . docusate sodium  100 mg  Oral BID  . enoxaparin  30 mg Subcutaneous Q12H  . levothyroxine  125 mcg Oral Daily  . morphine   Intravenous Q4H  . norethindrone-ethinyl estradiol  1 tablet Oral Daily  . vancomycin  1,000 mg Intravenous 60 min Pre-Op  . vancomycin  1,000 mg Intravenous Q12H   Infusions:    . 0.9 % NaCl with KCl 20 mEq / L     PRN: acetaminophen, acetaminophen, diazepam, diphenhydrAMINE, diphenhydrAMINE, menthol-cetylpyridinium, methocarbamol(ROBAXIN) IV, methocarbamol, naloxone, ondansetron (ZOFRAN) IV, ondansetron, oxyCODONE-acetaminophen, phenol, sodium chloride, sodium phosphate, SUMAtriptan, DISCONTD: bupivacaine, DISCONTD: HYDROmorphone, DISCONTD: morphine, DISCONTD: ondansetron (ZOFRAN) IV, DISCONTD: ondansetron (ZOFRAN) IV, DISCONTD: sodium chloride irrigation Anti-infectives     Start     Dose/Rate Route Frequency Ordered Stop   08/04/11 2100   vancomycin (VANCOCIN) IVPB 1000 mg/200 mL premix        1,000 mg 200 mL/hr over 60 Minutes Intravenous Every 12 hours 08/04/11 1441 08/05/11 2059   08/04/11 0500   vancomycin (VANCOCIN) IVPB 1000 mg/200 mL premix        1,000 mg 200 mL/hr over 60 Minutes Intravenous 60 min pre-op 08/03/11 1514 08/04/11 0852          Assessment: 66 yo female s/p R. TKA to start on Coumadin for VTE prophylaxis and bridge with Lovenox 30mg  sq q12h. Renal function appropriate for this lovenox dose based on SCr 0.69 on  07/28/11. Baseline INR =1.0, H/H 12.5/36.5, pltc 322.  Goal of Therapy:  INR 2-3   Plan:  Coumadin 7.5mg  po x1 INR daily.  Coumadin education book and video ordered.   Arman Filter 08/04/2011,3:00 PM

## 2011-08-04 NOTE — Interval H&P Note (Signed)
History and Physical Interval Note:  08/04/2011 8:36 AM  Valerie Gentry  has presented today for surgery, with the diagnosis of DJD RIGHT KNEE  The various methods of treatment have been discussed with the patient and family. After consideration of risks, benefits and other options for treatment, the patient has consented to  Procedure(s) (LRB): TOTAL KNEE ARTHROPLASTY (Right) as a surgical intervention .  The patients' history has been reviewed, patient examined, no change in status, stable for surgery.  I have reviewed the patients' chart and labs.  Questions were answered to the patient's satisfaction.     Merrit Waugh F

## 2011-08-04 NOTE — Anesthesia Postprocedure Evaluation (Signed)
Anesthesia Post Note  Patient: Valerie Gentry  Procedure(s) Performed: Procedure(s) (LRB): TOTAL KNEE ARTHROPLASTY (Right)  Anesthesia type: General  Patient location: PACU  Post pain: Pain level controlled and Adequate analgesia  Post assessment: Post-op Vital signs reviewed, Patient's Cardiovascular Status Stable, Respiratory Function Stable, Patent Airway and Pain level controlled  Last Vitals:  Filed Vitals:   08/04/11 1200  BP:   Pulse: 59  Temp:   Resp: 13    Post vital signs: Reviewed and stable  Level of consciousness: awake, alert  and oriented  Complications: No apparent anesthesia complications

## 2011-08-05 ENCOUNTER — Encounter (HOSPITAL_COMMUNITY): Payer: Self-pay | Admitting: Orthopedic Surgery

## 2011-08-05 LAB — BASIC METABOLIC PANEL
BUN: 13 mg/dL (ref 6–23)
CO2: 24 mEq/L (ref 19–32)
Chloride: 107 mEq/L (ref 96–112)
Creatinine, Ser: 0.61 mg/dL (ref 0.50–1.10)
Glucose, Bld: 120 mg/dL — ABNORMAL HIGH (ref 70–99)

## 2011-08-05 LAB — CBC
HCT: 31 % — ABNORMAL LOW (ref 36.0–46.0)
MCV: 92.8 fL (ref 78.0–100.0)
RBC: 3.34 MIL/uL — ABNORMAL LOW (ref 3.87–5.11)
WBC: 8.9 10*3/uL (ref 4.0–10.5)

## 2011-08-05 MED ORDER — WARFARIN SODIUM 7.5 MG PO TABS
7.5000 mg | ORAL_TABLET | Freq: Once | ORAL | Status: AC
Start: 2011-08-05 — End: 2011-08-06
  Filled 2011-08-05: qty 1

## 2011-08-05 NOTE — Progress Notes (Signed)
Clinical Social Work-CSW met with pt and husband at bedside-pt requested [facility]-CSW contacted Rehab and they may be able to accept pt tomorrow however only into a semiprivate room. Pt will be able to move to a private room over weekend. CSW will initiate FL2 and bed search and will update pt and husband PRN-Jackey Housey-MSW, [phone]

## 2011-08-05 NOTE — Progress Notes (Signed)
ANTICOAGULATION CONSULT NOTE - Follow Up  Pharmacy Consult for Coumadin Indication: VTE prophylaxis  Allergies  Allergen Reactions  . Ampicillin Rash    Patient Measurements: Height: 5\' 5"  (165.1 cm) (from 07/27/11 preadmit) Weight: 199 lb 4.7 oz (90.399 kg) (from 07/28/11 preadmit) IBW/kg (Calculated) : 57    Vital Signs: Temp: 98.6 F (37 C) (02/14 1300) BP: 114/56 mmHg (02/14 1300) Pulse Rate: 74  (02/14 1300)  Preadmisson labs 07/28/11 PTT 28, PT 13.4, INR 1.00 Hgb 12.5,  Hct 36.5, PLTC 322 WBC 6.1 Scr 0.69  Basename 08/05/11 0515  HGB 10.6*  HCT 31.0*  PLT 258  APTT --  LABPROT 14.2  INR 1.08  HEPARINUNFRC --  CREATININE 0.61  CKTOTAL --  CKMB --  TROPONINI --   Estimated Creatinine Clearance: 80 ml/min (by C-G formula based on Cr of 0.61).  Medical History: Past Medical History  Diagnosis Date  . PONV (postoperative nausea and vomiting)   . Hypothyroidism   . GERD (gastroesophageal reflux disease)   . Arthritis   . Anemia     Medications:  Prescriptions prior to admission  Medication Sig Dispense Refill  . celecoxib (CELEBREX) 200 MG capsule Take 200 mg by mouth 2 (two) times daily.      . Cholecalciferol (VITAMIN D-3) 5000 UNITS TABS Take 1 tablet by mouth daily.      . diazepam (VALIUM) 5 MG tablet Take 5 mg by mouth every 12 (twelve) hours as needed. For anxiety      . Eszopiclone (ESZOPICLONE) 3 MG TABS Take 3 mg by mouth at bedtime as needed. Take immediately before bedtime for sleep      . levothyroxine (SYNTHROID, LEVOTHROID) 125 MCG tablet Take 125 mcg by mouth daily.      . norethindrone-ethinyl estradiol (FEMHRT 1/5) 1-5 MG-MCG TABS Take 1 tablet by mouth daily.      . SUMAtriptan (IMITREX) 50 MG tablet Take 50 mg by mouth every 2 (two) hours as needed. For migrane headache      . traMADol (ULTRAM) 50 MG tablet Take 100 mg by mouth every 8 (eight) hours as needed. For pain       Scheduled:     . coumadin book   Does not apply Once  .  docusate sodium  100 mg Oral BID  . enoxaparin  30 mg Subcutaneous Q12H  . levothyroxine  125 mcg Oral Daily  . norethindrone-ethinyl estradiol  1 tablet Oral Daily  . vancomycin  1,000 mg Intravenous Q12H  . warfarin  7.5 mg Oral ONCE-1800  . warfarin   Does not apply Once  . DISCONTD: morphine   Intravenous Q4H   Infusions:     . 0.9 % NaCl with KCl 20 mEq / L 90 mL/hr at 08/04/11 1756   PRN: acetaminophen, acetaminophen, diazepam, menthol-cetylpyridinium, methocarbamol(ROBAXIN) IV, methocarbamol, ondansetron, oxyCODONE-acetaminophen, phenol, sodium phosphate, SUMAtriptan, DISCONTD: diphenhydrAMINE, DISCONTD: diphenhydrAMINE, DISCONTD: naloxone, DISCONTD: ondansetron (ZOFRAN) IV, DISCONTD: sodium chloride Anti-infectives     Start     Dose/Rate Route Frequency Ordered Stop   08/04/11 2100   vancomycin (VANCOCIN) IVPB 1000 mg/200 mL premix        1,000 mg 200 mL/hr over 60 Minutes Intravenous Every 12 hours 08/04/11 1441 08/05/11 0948   08/04/11 0500   vancomycin (VANCOCIN) IVPB 1000 mg/200 mL premix        1,000 mg 200 mL/hr over 60 Minutes Intravenous 60 min pre-op 08/03/11 1514 08/04/11 0852          Assessment:  66 yo female s/p R. TKA POD#1 on Coumadin for VTE prophylaxis and bridge with Lovenox 30mg  sq q12h. Renal function stable, appropriate for lovenox dose. Today INR = 1.08 (Baseline INR =1.0). H/H decreased slightly. No bleeding reported.    Goal of Therapy:  INR 2-3   Plan:  Coumadin 7.5mg  po x1 INR daily.   Arman Filter 08/05/2011,3:00 PM

## 2011-08-05 NOTE — Progress Notes (Signed)
Physical Therapy Note    08/05/11 1500  PT Visit Information  Last PT Received On 08/05/11  Precautions  Precautions Knee  Required Braces or Orthoses Yes  Knee Immobilizer On except when in CPM  Restrictions  Weight Bearing Restrictions Yes  RLE Weight Bearing WBAT  Bed Mobility  Bed Mobility Yes  Sit to Supine 4: Min assist  Sit to Supine - Details (indicate cue type and reason) A with LE only.    Transfers  Transfers Yes  Sit to Stand 5: Supervision;With upper extremity assist;With armrests;From chair/3-in-1  Sit to Stand Details (indicate cue type and reason) good use of UEs, cues to wait till PT had RW in front of pt.    Stand to Sit 4: Min assist;With upper extremity assist;To bed  Ambulation/Gait  Ambulation/Gait Yes  Ambulation/Gait Assistance 4: Min assist  Ambulation/Gait Assistance Details (indicate cue type and reason) cues for upright posture, decrease WBing on RW  Ambulation Distance (Feet) 60 Feet  Assistive device Rolling walker  Gait Pattern Step-to pattern;Trunk flexed  Stairs No  Wheelchair Mobility  Wheelchair Mobility No  Posture/Postural Control  Posture/Postural Control No significant limitations  Balance  Balance Assessed No  Exercises  Exercises Total Joint  Total Joint Exercises  Long Arc Quad Right;5 reps;AAROM (pt self-limits number of reps)  Knee Flexion AROM;5 reps;Right (pt self-limits number of reps and refuses to let PT A.  )  PT - End of Session  Equipment Utilized During Treatment Gait belt;Right knee immobilizer  Activity Tolerance Patient tolerated treatment well;Patient limited by pain  Patient left in bed;in CPM;with call bell in reach  Nurse Communication Mobility status for transfers;Mobility status for ambulation  General  Behavior During Session [facility] for tasks performed  Cognition [facility] for tasks performed  PT - Assessment/Plan  Comments on Treatment Session pt presents s/p TKA.  pt self-limiting ther ex this pm.  pt placed  in CPM 0-65degrees.    PT Plan Discharge plan remains appropriate;Frequency remains appropriate  PT Frequency 7X/week  Follow Up Recommendations Skilled nursing facility  Equipment Recommended Defer to next venue  Acute Rehab PT Goals  PT Goal: Supine/Side to Sit - Progress Progressing toward goal  PT Goal: Sit to Supine/Side - Progress Progressing toward goal  PT Goal: Sit to Stand - Progress Progressing toward goal  PT Goal: Ambulate - Progress Progressing toward goal  PT Goal: Perform Home Exercise Program - Progress Progressing toward goal     Mack Hook, PT [phone]

## 2011-08-05 NOTE — Progress Notes (Signed)
Subjective: Doing well.  Pain controlled.  Needs short snf for rehab.   Objective: Vital signs in last 24 hours: Temp:  [98.3 F (36.8 C)-99.3 F (37.4 C)] 98.6 F (37 C) (02/14 0540) Pulse Rate:  [55-74] 63  (02/14 0540) Resp:  [11-20] 16  (02/14 0540) BP: (114-150)/(48-76) 129/64 mmHg (02/14 0540) SpO2:  [90 %-100 %] 97 % (02/14 0540) Weight:  [90.399 kg (199 lb 4.7 oz)] 90.399 kg (199 lb 4.7 oz) (02/13 1333)  Intake/Output from previous day: 02/13 0701 - 02/14 0700 In: 2764.5 [I.V.:2564.5; IV Piggyback:200] Out: 3475 [Urine:3175; Drains:300] Intake/Output this shift:     Basename 08/05/11 0515  HGB 10.6*    Basename 08/05/11 0515  WBC 8.9  RBC 3.34*  HCT 31.0*  PLT 258    Basename 08/05/11 0515  NA 139  K 4.0  CL 107  CO2 24  BUN 13  CREATININE 0.61  GLUCOSE 120*  CALCIUM 8.5    Basename 08/05/11 0515  LABPT --  INR 1.08    Neurovascular intact.  Calf nt.  Dressing c/d/i.   Assessment/Plan: Transfer to snf for rehab tomorrow.  Patient has bed at facility available.  D/c pca.    Khayla Koppenhaver M 08/05/2011, 8:42 AM

## 2011-08-05 NOTE — Op Note (Signed)
NAMEEMRIE, GAYLE NO.:  [id number]  MEDICAL RECORD NO.:  [id number]  LOCATION:  5021                         FACILITY:  [facility]  PHYSICIAN:  Loreta Ave, M.D. DATE OF BIRTH:  1945-10-16  DATE OF PROCEDURE: DATE OF DISCHARGE:                              OPERATIVE REPORT   PREOPERATIVE DIAGNOSIS:  Right knee end-stage degenerative arthritis, varus alignment.  POSTOPERATIVE DIAGNOSIS:  Right knee end-stage degenerative arthritis, varus alignment.  PROCEDURE:  Right knee modified minimally invasive total knee replacement, Stryker triathlon prosthesis.  Soft tissue balancing. Cemented pegged posterior stabilized #5 femoral component.  Cemented #5 tibial component, 9-mm polyethylene insert.  Cemented resurfacing 38-mm patellar component.  SURGEON:  Loreta Ave, MD  ASSISTANT:  Genene Churn. Barry Dienes, [place], present throughout the entire case and necessary for timely completion of procedure.  ANESTHESIA:  General.  BLOOD LOSS:  Minimal.  SPECIMENS:  None.  CULTURES:  None.  COMPLICATIONS:  None.  DRESSINGS:  Soft compressive with knee immobilizer.  DRAINS:  Hemovac x1.  TOURNIQUET TIME:  1 hour.  PROCEDURE:  The patient was brought to the operating room and placed on the operating table in supine position.  After adequate anesthesia had been obtained, tourniquet applied, prepped and draped in usual sterile fashion.  Exsanguinated with elevation of Esmarch.  Tourniquet inflated to 350 mmHg.  Straight incision above the patella, down to tibial tubercle.  Medial arthrotomy, vastus splitting, preserving quad tendon. Knee exposed.  Grade 4 changes most marked medially.  Medial capsule released.  Remnants of menisci, cruciate ligaments, periarticular spurs, loose bodies removed.  Distal femur exposed.  Intramedullary guide. Distal cut 10 mm, 5 degrees of valgus.  Using epicondylar axis, the femur was sized, cut, and fitted for a cemented pegged  posterior stabilized #5 component.  Proximal tibial resection, 3-degree posterior slope cut.  Size #5 component as well.  Debris cleared throughout the knee including posterior recess.  Patella exposed; posterior 10 mm removed; drilled, sized, and fitted for a 38-mm component.  Trials put in place throughout.  With a 9-mm insert, I was very pleased with mechanical axis, balancing in flexion, extension, full motion, good patellofemoral tracking.  Tibia was marked for rotation and reamed.  All trials removed.  Copious irrigation with a pulse irrigating device. Cement prepared, placed on all components, firmly seated.  Patella held with a clamp.  Polyethylene attached to tibia, knee reduced.  Once the cement hardened, reexamined.  Again, pleased with all aspects.  Wound irrigated.  Hemovac placed and brought out through a separate stab wound.  Arthrotomy closed with #1 Vicryl.  Skin and subcutaneous tissue with Vicryl and staples.  Sterile compressive dressing applied. Tourniquet deflated and removed.  Knee immobilizer applied.  Anesthesia reversed.  Brought to recovery room.  Tolerated the surgery well.  No complications.     Loreta Ave, M.D.     DFM/MEDQ  D:  08/04/2011  T:  08/05/2011  Job:  [id number]

## 2011-08-05 NOTE — Progress Notes (Signed)
Physical Therapy Evaluation Patient Details Name: Valerie Gentry MRN: [id number] DOB: [birth year] Today's Date: 08/05/2011  Problem List: There is no problem list on file for this patient.   Past Medical History:  Past Medical History  Diagnosis Date  . PONV (postoperative nausea and vomiting)   . Hypothyroidism   . GERD (gastroesophageal reflux disease)   . Arthritis   . Anemia    Past Surgical History:  Past Surgical History  Procedure Date  . Joint replacement     left knee 2012  . Knee arthroscopy     PT Assessment/Plan/Recommendation PT Assessment Clinical Impression Statement: pt presents s/p R TKA.  pt's IV came out and pt only had PCA ordered for pain control.  pt still agreeable to mobility, but held ROM secondary to no pain meds.   PT Recommendation/Assessment: Patient will need skilled PT in the acute care venue PT Problem List: Decreased strength;Decreased range of motion;Decreased activity tolerance;Decreased balance;Decreased mobility;Decreased knowledge of use of DME;Pain Barriers to Discharge: None PT Therapy Diagnosis : Abnormality of gait;Acute pain PT Plan PT Frequency: 7X/week PT Treatment/Interventions: DME instruction;Gait training;Stair training;Functional mobility training;Therapeutic activities;Therapeutic exercise;Balance training;Patient/family education PT Recommendation Follow Up Recommendations: Skilled nursing facility Equipment Recommended: Defer to next venue PT Goals  Acute Rehab PT Goals PT Goal Formulation: With patient Time For Goal Achievement: 7 days Pt will go Supine/Side to Sit: Independently PT Goal: Supine/Side to Sit - Progress: Goal set today Pt will go Sit to Supine/Side: Independently PT Goal: Sit to Supine/Side - Progress: Goal set today Pt will go Sit to Stand: with modified independence;with upper extremity assist PT Goal: Sit to Stand - Progress: Goal set today Pt will Ambulate: >150 feet;with modified independence;with  rolling walker PT Goal: Ambulate - Progress: Goal set today Pt will Perform Home Exercise Program: Independently PT Goal: Perform Home Exercise Program - Progress: Goal set today  PT Evaluation Precautions/Restrictions  Precautions Precautions: Knee Required Braces or Orthoses: Yes Knee Immobilizer: On except when in CPM Restrictions Weight Bearing Restrictions: Yes RLE Weight Bearing: Weight bearing as tolerated Prior Functioning  Home Living Lives With: Alone Type of Home: House Home Layout: One level Home Adaptive Equipment: Walker - rolling;Straight cane Additional Comments: pt plans to D/C to SNF.   Prior Function Level of Independence: Independent with basic ADLs;Independent with homemaking with ambulation;Independent with gait;Independent with transfers Cognition Cognition Orientation Level: Oriented X4 Sensation/Coordination   Extremity Assessment RLE Assessment RLE Assessment:  (ROM NT secondary to no pain meds) LLE Assessment LLE Assessment: Within Functional Limits Mobility (including Balance) Bed Mobility Bed Mobility: Yes Supine to Sit: 4: Min assist Supine to Sit Details (indicate cue type and reason): A with R LE only.  demos good technique.   Sitting - Scoot to Edge of Bed: 4: Min assist Sitting - Scoot to [place] of Bed Details (indicate cue type and reason): A withR LE only Transfers Transfers: Yes Sit to Stand: 4: Min assist;With upper extremity assist;From bed Sit to Stand Details (indicate cue type and reason): demos good technique Stand to Sit: 4: Min assist;With upper extremity assist;To chair/3-in-1 Stand to Sit Details: cues for LE positioning and to control descent.   Ambulation/Gait Ambulation/Gait: Yes Ambulation/Gait Assistance: 4: Min assist Ambulation/Gait Assistance Details (indicate cue type and reason): cue for sequencing, upright posture, decrease wt bearing Ambulation Distance (Feet): 40 Feet Assistive device: Rolling walker Gait  Pattern: Step-to pattern;Trunk flexed Stairs: No Wheelchair Mobility Wheelchair Mobility: No  Posture/Postural Control Posture/Postural Control: No significant limitations Balance  Balance Assessed: No Exercise  Total Joint Exercises Ankle Circles/Pumps: AROM;Both;10 reps Quad Sets: AROM;Both;10 reps End of Session PT - End of Session Equipment Utilized During Treatment: Gait belt;Right knee immobilizer Activity Tolerance: Patient tolerated treatment well;Patient limited by pain Patient left: in chair;with call bell in reach Nurse Communication: Mobility status for transfers;Mobility status for ambulation General Behavior During Session: [facility] for tasks performed Cognition: [facility] for tasks performed  Sunny Schlein, [place] [phone] 08/05/2011, 10:33 AM

## 2011-08-06 LAB — BASIC METABOLIC PANEL WITH GFR
BUN: 12 mg/dL (ref 6–23)
CO2: 25 meq/L (ref 19–32)
Calcium: 8.7 mg/dL (ref 8.4–10.5)
Chloride: 106 meq/L (ref 96–112)
Creatinine, Ser: 0.69 mg/dL (ref 0.50–1.10)
GFR calc Af Amer: >90 mL/min
GFR calc non Af Amer: >90 mL/min
Glucose, Bld: 110 mg/dL — ABNORMAL HIGH (ref 70–99)
Potassium: 4 meq/L (ref 3.5–5.1)
Sodium: 140 meq/L (ref 135–145)

## 2011-08-06 LAB — PROTIME-INR
INR: 1.13 (ref 0.00–1.49)
Prothrombin Time: 14.7 s (ref 11.6–15.2)

## 2011-08-06 LAB — CBC
HCT: 31.6 % — ABNORMAL LOW (ref 36.0–46.0)
Hemoglobin: 10.6 g/dL — ABNORMAL LOW (ref 12.0–15.0)
MCH: 30.8 pg (ref 26.0–34.0)
MCHC: 33.5 g/dL (ref 30.0–36.0)
MCV: 91.9 fL (ref 78.0–100.0)
Platelets: 247 10*3/uL (ref 150–400)
RBC: 3.44 MIL/uL — ABNORMAL LOW (ref 3.87–5.11)
RDW: 13.1 % (ref 11.5–15.5)
WBC: 8.8 10*3/uL (ref 4.0–10.5)

## 2011-08-06 MED ORDER — WARFARIN SODIUM 7.5 MG PO TABS
7.5000 mg | ORAL_TABLET | Freq: Once | ORAL | Status: AC
  Administered 2011-08-06: 7.5 mg via ORAL
  Filled 2011-08-06: qty 1

## 2011-08-06 NOTE — Progress Notes (Signed)
Clinical Social Work-CSW faxed draft d/c summary to [facility] and left report for weekend SW in order to facilitate d/c to SNF on [place], [phone]

## 2011-08-06 NOTE — Progress Notes (Signed)
ANTICOAGULATION CONSULT NOTE - Follow Up  Pharmacy Consult for Coumadin Indication: VTE prophylaxis  Allergies  Allergen Reactions  . Ampicillin Rash    Patient Measurements: Height: 5\' 5"  (165.1 cm) (from 07/27/11 preadmit) Weight: 199 lb 4.7 oz (90.399 kg) (from 07/28/11 preadmit) IBW/kg (Calculated) : 57    Vital Signs: Temp: 98.6 F (37 C) (02/15 0700) Temp src: Oral (02/15 0700) BP: 154/87 mmHg (02/15 0700) Pulse Rate: 81  (02/15 0700)  Preadmisson labs 07/28/11 PTT 28, PT 13.4, INR 1.00 Hgb 12.5,  Hct 36.5, PLTC 322 WBC 6.1 Scr 0.69  Basename 08/06/11 0700 08/05/11 0515  HGB 10.6* 10.6*  HCT 31.6* 31.0*  PLT 247 258  APTT -- --  LABPROT 14.7 14.2  INR 1.13 1.08  HEPARINUNFRC -- --  CREATININE 0.69 0.61  CKTOTAL -- --  CKMB -- --  TROPONINI -- --   Estimated Creatinine Clearance: 80 ml/min (by C-G formula based on Cr of 0.69).  Medical History: Past Medical History  Diagnosis Date  . PONV (postoperative nausea and vomiting)   . Hypothyroidism   . GERD (gastroesophageal reflux disease)   . Arthritis   . Anemia     Medications:  Prescriptions prior to admission  Medication Sig Dispense Refill  . celecoxib (CELEBREX) 200 MG capsule Take 200 mg by mouth 2 (two) times daily.      . Cholecalciferol (VITAMIN D-3) 5000 UNITS TABS Take 1 tablet by mouth daily.      . diazepam (VALIUM) 5 MG tablet Take 5 mg by mouth every 12 (twelve) hours as needed. For anxiety      . Eszopiclone (ESZOPICLONE) 3 MG TABS Take 3 mg by mouth at bedtime as needed. Take immediately before bedtime for sleep      . levothyroxine (SYNTHROID, LEVOTHROID) 125 MCG tablet Take 125 mcg by mouth daily.      . norethindrone-ethinyl estradiol (FEMHRT 1/5) 1-5 MG-MCG TABS Take 1 tablet by mouth daily.      . SUMAtriptan (IMITREX) 50 MG tablet Take 50 mg by mouth every 2 (two) hours as needed. For migrane headache      . traMADol (ULTRAM) 50 MG tablet Take 100 mg by mouth every 8 (eight) hours as  needed. For pain       Scheduled:     . docusate sodium  100 mg Oral BID  . enoxaparin  30 mg Subcutaneous Q12H  . levothyroxine  125 mcg Oral Daily  . warfarin  7.5 mg Oral ONCE-1800  . warfarin   Does not apply Once  . DISCONTD: norethindrone-ethinyl estradiol  1 tablet Oral Daily   Infusions:     . DISCONTD: 0.9 % NaCl with KCl 20 mEq / L 90 mL/hr at 08/04/11 1756   PRN: acetaminophen, acetaminophen, diazepam, menthol-cetylpyridinium, methocarbamol(ROBAXIN) IV, methocarbamol, ondansetron, oxyCODONE-acetaminophen, phenol, SUMAtriptan Anti-infectives     Start     Dose/Rate Route Frequency Ordered Stop   08/04/11 2100   vancomycin (VANCOCIN) IVPB 1000 mg/200 mL premix        1,000 mg 200 mL/hr over 60 Minutes Intravenous Every 12 hours 08/04/11 1441 08/05/11 0948   08/04/11 0500   vancomycin (VANCOCIN) IVPB 1000 mg/200 mL premix        1,000 mg 200 mL/hr over 60 Minutes Intravenous 60 min pre-op 08/03/11 1514 08/04/11 0852          Assessment: 66 yo female s/p R. TKA POD#2 on Coumadin for VTE prophylaxis and bridge with Lovenox 30mg  sq q12h. Renal function  stable, appropriate for lovenox dose. Today INR = 1.13 (Baseline INR =1.0). H/H decreased post op, but stable. No bleeding reported per chart notes.   Of note - no Coumadin dose was charted 2/14.   Goal of Therapy:  INR 2-3   Plan:  Coumadin 7.5mg  po x1 INR daily.   Angeletta Goelz C 08/06/2011,12:22 PM

## 2011-08-06 NOTE — Progress Notes (Signed)
Utilization review completed. Gilverto Dileonardo, RN, BSN. 08/06/11  

## 2011-08-06 NOTE — Progress Notes (Signed)
Physical Therapy Treatment Patient Details Name: Valerie Gentry MRN: [id number] DOB: 1946/04/21 Today's Date: 08/06/2011  PT Assessment/Plan  PT - Assessment/Plan Comments on Treatment Session: Pt. reluctant to advance her gait this am .  Knee flex limited to 50 degrees currently. PT Plan: Discharge plan remains appropriate PT Frequency: 7X/week Follow Up Recommendations: Skilled nursing facility Equipment Recommended: Defer to next venue PT Goals  Acute Rehab PT Goals Time For Goal Achievement: 7 days PT Goal: Sit to Stand - Progress: Progressing toward goal PT Goal: Ambulate - Progress: Progressing toward goal PT Goal: Perform Home Exercise Program - Progress: Progressing toward goal  PT Treatment Precautions/Restrictions  Precautions Precautions: Knee Required Braces or Orthoses: Yes Knee Immobilizer: On except when in CPM Restrictions Weight Bearing Restrictions: Yes RLE Weight Bearing: Weight bearing as tolerated Mobility (including Balance) Bed Mobility Bed Mobility: No Transfers Transfers: Yes Sit to Stand: 5: Supervision;With upper extremity assist;With armrests;From chair/3-in-1 Sit to Stand Details (indicate cue type and reason): cues for hand placement and technique Stand to Sit: 4: Min assist;With upper extremity assist;To bed Ambulation/Gait Ambulation/Gait: Yes Ambulation/Gait Assistance: 4: Min assist Ambulation/Gait Assistance Details (indicate cue type and reason): cues for sequence and technique Ambulation Distance (Feet): 60 Feet Assistive device: Rolling walker Gait Pattern: Step-to pattern;Trunk flexed    Exercise  Total Joint Exercises Ankle Circles/Pumps: AROM;Both;10 reps Quad Sets: AROM;Both;10 reps Short Arc QuadBarbaraann Boys;Right Knee Flexion: AROM;5 reps;Right (AAROM 0-50 degrees) End of Session PT - End of Session Equipment Utilized During Treatment: Gait belt;Right knee immobilizer Activity Tolerance: Patient tolerated treatment  well;Patient limited by pain Patient left: in chair;with call bell in reach;with family/visitor present Nurse Communication: Mobility status for transfers;Mobility status for ambulation;Weight bearing status General Behavior During Session: [facility] for tasks performed Cognition: [facility] for tasks performed  Ferman Hamming 08/06/2011, 3:02 PM Acute Rehabilitation Services [phone] (pager)

## 2011-08-06 NOTE — Discharge Summary (Signed)
NAMEALECEA, TREGO NO.:  [id number]  MEDICAL RECORD NO.:  [id number]  LOCATION:  5021                         FACILITY:  [facility]  PHYSICIAN:  Loreta Ave, M.D. DATE OF BIRTH:  Sep 13, 1945  DATE OF ADMISSION:  08/04/2011 DATE OF DISCHARGE:                              DISCHARGE SUMMARY   FINAL DIAGNOSES: 1. Status post right total knee replacement for end-stage degenerative     joint disease. 2. Hypothyroid. 3. Osteoporosis. 4. History of benign bowel neoplasm. 5. Migraines.  HISTORY OF PRESENT ILLNESS:  A 66 year old white female with history of end-stage DJD right knee and chronic pain who presented to our office for preop evaluation for total knee replacement.  She had progressively worsening pain with failure to response with conservative treatment. Significant decrease in her daily activities due to the ongoing complaint.  HOSPITAL COURSE:  On August 04, 2011, the patient was taken to the [facility] OR, and a right total knee replacement procedure performed.  SURGEON:  Loreta Ave, MD  ASSISTANT:  Zonia Kief, PA-C  ANESTHESIA:  General with femoral nerve block.  TOURNIQUET TIME:  81 minute.  No specimens or cultures.  DRAINS:  One Hemovac drain used.  The patient tolerated the  procedure well and was transferred to recovery room in stable condition.  After arriving to the orthopedic unit, a pharmacy protocol Coumadin and Lovenox started for DVT prophylaxis.  Placed in a CPM.  August 05, 2011, the patient doing very well with  good pain control.  Awaiting skilled nursing facility placement for rehab.  Vital signs stable and afebrile.  Hemoglobin 10.6, hematocrit 31.0.  Sodium 139, potassium 4.0, chloride 107, CO2 24, BUN 13, creatinine 0.61, glucose 120, INR 1.08. Right lower extremity dressing clean, dry, and intact.  Calf nontender neurovascularly.  Skin warm and dry.  PT, OT consults.  August 06, 2011, the patient again  doing well, and we are awaiting transfer to a skilled nursing facility in [place].  Vital signs stable and afebrile.  Hemoglobin 10.6, hematocrit 31.6.  Sodium  140, potassium 4.0, chloride 106, CO2 25, BUN 12, creatinine 0.69, glucose 110, INR 1.13.  Right knee wound looks good and staples intact.  No drainage or signs of infection.  Hemovac drain removed.  Discontinued Foley and transferred to a skilled facility when bed available.  CONDITION:  Good and stable.  DISPOSITION:  Transferred to a skilled nursing facility.  MEDICATIONS: 1. Percocet 5/325 1-2 tablets p.o. q.4-6 h. p.r.n. for pain. 2. Robaxin 500 mg 1 tablet p.o. q.6 h. p.r.n. for spasms. 3. Coumadin per pharmacy protocol.  Maintain INR 2-3 x4 weeks postop     for DVT prophylaxis. 4. Lovenox 30 mg 1 subcu injection q.12 hours and discontinue when     Coumadin is therapeutic with INR 2-3. 5. Imitrex 50 mg 1 tablet p.o. q.8-12 h. p.r.n. for migraines. 6. Colace 100 mg p.o. b.i.d. 7. Synthroid 125 mcg 1 tablet p.o. daily.  INSTRUCTIONS:  While at the skilled nursing facility, the patient will continue to work with PT and OT to improve ambulation and knee range of motion and strengthening.  Weightbear as tolerated with walker  and can wean to a single prong cane as tolerated.  CPM 0-70 degrees 6-8 hours daily and increase by 10 degrees daily as tolerated.  Okay to shower but no tub soaking.  Dressing changes with a 4 x 4 gauze, and apply TED hose over this.  Do not apply any creams or ointments to her incision.  Knee staples to be removed 2 weeks postop and this can be done in our office at followup.  Coumadin x4 weeks postop for DVT prophylaxis and discontinue Lovenox when Coumadin is therapeutic with INR 2-3.  Needs followup visit with Dr. Eulah Pont when she is 2 weeks postop for recheck. Call our office immediately if there are any questions or concerns before appointment scheduled.     Genene Churn. Denton Meek.   ______________________________ Loreta Ave, M.D.    JMO/MEDQ  D:  08/06/2011  T:  08/06/2011  Job:  [id number]

## 2011-08-06 NOTE — Progress Notes (Signed)
Clinical Social Work-CSW confirmed pt first choice of facility-[facility] will be able to accept over weekend. SNF requesting d/c paperwork as soon as possible. CSW will follow for d/c planning. Bonnye Fava

## 2011-08-06 NOTE — Progress Notes (Signed)
Subjective: Doing well.  Pain controlled.  Ready for transfer to snf.   Objective: Vital signs in last 24 hours: Temp:  [97.3 F (36.3 C)-98.6 F (37 C)] 98.6 F (37 C) (02/15 0700) Pulse Rate:  [71-81] 81  (02/15 0700) Resp:  [18-20] 20  (02/15 0700) BP: (124-160)/(59-87) 154/87 mmHg (02/15 0700) SpO2:  [92 %-100 %] 95 % (02/15 0700)  Intake/Output from previous day: 02/14 0701 - 02/15 0700 In: 240 [P.O.:240] Out: 2550 [Urine:2450; Drains:100] Intake/Output this shift:     Basename 08/06/11 0700 08/05/11 0515  HGB 10.6* 10.6*    Basename 08/06/11 0700 08/05/11 0515  WBC 8.8 8.9  RBC 3.44* 3.34*  HCT 31.6* 31.0*  PLT 247 258    Basename 08/06/11 0700 08/05/11 0515  NA 140 139  K 4.0 4.0  CL 106 107  CO2 25 24  BUN 12 13  CREATININE 0.69 0.61  GLUCOSE 110* 120*  CALCIUM 8.7 8.5    Basename 08/06/11 0700 08/05/11 0515  LABPT -- --  INR 1.13 1.08    Wound looks good.  Staples intact.  No drainage or signs of infection.  hemovac removed.   Assessment/Plan: D/c foley.  Transfer to snf when bed available.   Envy Meno M 08/06/2011, 1:27 PM

## 2011-08-07 LAB — BASIC METABOLIC PANEL
BUN: 13 mg/dL (ref 6–23)
CO2: 26 mEq/L (ref 19–32)
GFR calc non Af Amer: >90 mL/min (ref >90)
Glucose, Bld: 109 mg/dL — ABNORMAL HIGH (ref 70–99)
Potassium: 3.8 mEq/L (ref 3.5–5.1)

## 2011-08-07 LAB — PROTIME-INR
INR: 1.11 (ref 0.00–1.49)
Prothrombin Time: 14.5 s (ref 11.6–15.2)

## 2011-08-07 LAB — CBC
HCT: 32 % — ABNORMAL LOW (ref 36.0–46.0)
Hemoglobin: 10.8 g/dL — ABNORMAL LOW (ref 12.0–15.0)
MCHC: 33.8 g/dL (ref 30.0–36.0)

## 2011-08-07 MED ORDER — WARFARIN SODIUM 10 MG PO TABS
10.0000 mg | ORAL_TABLET | Freq: Once | ORAL | Status: DC
Start: 2011-08-07 — End: 2011-08-07
  Filled 2011-08-07 (×2): qty 1

## 2011-08-07 NOTE — Progress Notes (Signed)
Report called to [facility]. Facility agreed with placement of pt. Pt and husband informed. Pt d/c'ed to [facility] [place] via [place] in stable condition. Husband present at time of transfer. All pt belongings sent with husband.

## 2011-08-07 NOTE — Progress Notes (Signed)
Pt to transfer to [facility] today via [facility]. Pt and SNF aware of d/c today and are agreeable. No other CSW needs. CSW signing off.  Dellie Burns, MSW, [place] [phone] (weekend)

## 2011-08-07 NOTE — Progress Notes (Signed)
Patient ID: Valerie Gentry, female   DOB: [birth year], 66 y.o.   MRN: [id number] PATIENT ID:      Valerie Gentry  MRN:     [id number] DOB/AGE:    1946-03-21 / 66 y.o.    PROGRESS NOTE Subjective:  negative for Chest Pain  negative for Shortness of Breath  negative for Nausea/Vomiting   negative for Calf Pain  positive for Bowel Movement   Tolerating Diet: yes         Patient reports pain as 4 on 0-10 scale.    Objective: Vital signs in last 24 hours:   Patient Vitals for the past 24 hrs:  BP Temp Temp src Pulse Resp SpO2  08/07/11 0601 127/73 mmHg 98 F (36.7 C) - 70  20  93 %  08/06/11 2118 120/73 mmHg 98.9 F (37.2 C) - 70  20  94 %  08/06/11 1300 165/96 mmHg 99.3 F (37.4 C) Oral 74  18  97 %    @flow {1959:LAST@   Intake/Output from previous day:   02/15 0701 - 02/16 0700 In: 200 [P.O.:200] Out: -    Intake/Output this shift:       Intake/Output      02/15 0701 - 02/16 0700 02/16 0701 - 02/17 0700   P.O. 200    Total Intake(mL/kg) 200 (2.2)    Urine (mL/kg/hr)     Drains     Total Output     Net +200            LABORATORY DATA:  Basename 08/07/11 0600 08/06/11 0700 08/05/11 0515  WBC 8.6 8.8 8.9  HGB 10.8* 10.6* 10.6*  HCT 32.0* 31.6* 31.0*  PLT 291 247 258    Basename 08/07/11 0600 08/06/11 0700 08/05/11 0515  NA 140 140 139  K 3.8 4.0 4.0  CL 105 106 107  CO2 26 25 24   BUN 13 12 13   CREATININE 0.60 0.69 0.61  GLUCOSE 109* 110* 120*  CALCIUM 8.9 8.7 8.5   Lab Results  Component Value Date   INR 1.11 08/07/2011   INR 1.13 08/06/2011   INR 1.08 08/05/2011    Examination:  General appearance: alert, cooperative and no distress  Wound Exam: clean, dry, intact   Drainage:  None: wound tissue dry  Motor Exam: EHL, FHL, Anterior Tibial and Posterior Tibial Intact  Sensory Exam: Superficial Peroneal, Deep Peroneal and Tibial normal  Vascular Exam: intact   Assessment:    3 Days Post-Op  Procedure(s) (LRB): TOTAL KNEE ARTHROPLASTY  (Right)  ADDITIONAL DIAGNOSIS:  Active Problems:  * No active hospital problems. *   Acute Blood Loss Anemia   Plan: Physical Therapy as ordered Weight Bearing as Tolerated (WBAT)  DVT Prophylaxis:  Lovenox and Coumadin  DISCHARGE PLAN: Skilled Nursing Facility/Rehab  DISCHARGE NEEDS: CPM, Walker and 3-in-1 comode seat     Discharge to [facility] in [place] today    [place], Anjolie Majer W 08/07/2011, 10:30 AM

## 2011-08-07 NOTE — Progress Notes (Signed)
Physical Therapy Treatment Patient Details Name: Valerie Gentry MRN: [id number] DOB: October 01, 1945 Today's Date: 08/07/2011  PT Assessment/Plan  PT - Assessment/Plan Comments on Treatment Session: Pt admitted s/p right TKA and continues to progress daily.  Pt able to increase independence/distance ambulated as well as tolerance to mobility. PT Plan: Discharge plan remains appropriate;Frequency remains appropriate PT Frequency: 7X/week Follow Up Recommendations: Skilled nursing facility Equipment Recommended: Defer to next venue PT Goals  Acute Rehab PT Goals PT Goal Formulation: With patient Time For Goal Achievement: 7 days PT Goal: Supine/Side to Sit - Progress: Progressing toward goal PT Goal: Sit to Stand - Progress: Progressing toward goal PT Goal: Ambulate - Progress: Progressing toward goal PT Goal: Perform Home Exercise Program - Progress: Progressing toward goal  PT Treatment Precautions/Restrictions  Precautions Precautions: Knee Precaution Booklet Issued: No Required Braces or Orthoses: Yes Knee Immobilizer: On except when in CPM (Right LE.) Restrictions Weight Bearing Restrictions: Yes RLE Weight Bearing: Weight bearing as tolerated Mobility (including Balance) Bed Mobility Bed Mobility: Yes Supine to Sit: 6: Modified independent (Device/Increase time) Sitting - Scoot to Edge of Bed: 6: Modified independent (Device/Increase time) Sit to Supine: Not Tested (comment) Transfers Transfers: Yes Sit to Stand: 5: Supervision;With upper extremity assist;From bed Sit to Stand Details (indicate cue type and reason): Verbal cues for safest hand placement to push up from bed. Stand to Sit: 5: Supervision;With upper extremity assist;To chair/3-in-1 Stand to Sit Details: Verbal cues for safest hand/right placement. Ambulation/Gait Ambulation/Gait: Yes Ambulation/Gait Assistance: 4: Min assist (Min (guard)) Ambulation/Gait Assistance Details (indicate cue type and reason):  Guarding for balance with cues for tall posture and safe sequence. Ambulation Distance (Feet): 180 Feet Assistive device: Rolling walker Gait Pattern: Step-to pattern;Trunk flexed Stairs: No Wheelchair Mobility Wheelchair Mobility: No  Posture/Postural Control Posture/Postural Control: No significant limitations Balance Balance Assessed: No Exercise  Total Joint Exercises Hip ABduction/ADduction: AROM;Right;10 reps;Supine Straight Leg Raises: AROM;Right;10 reps;Supine Knee Flexion: AROM;Right;10 reps;Seated (0-68 degrees of knee AROM today.) End of Session PT - End of Session Equipment Utilized During Treatment: Gait belt;Right knee immobilizer Activity Tolerance: Patient tolerated treatment well Patient left: in chair;with call bell in reach Nurse Communication: Mobility status for transfers;Mobility status for ambulation General Behavior During Session: [facility] for tasks performed Cognition: [facility] for tasks performed  Cephus Shelling 08/07/2011, 12:02 PM  08/07/2011 Cephus Shelling, PT, DPT [phone]

## 2011-08-07 NOTE — Progress Notes (Signed)
ANTICOAGULATION CONSULT NOTE - Follow Up  Pharmacy Consult for Coumadin Indication: VTE prophylaxis  Allergies  Allergen Reactions  . Ampicillin Rash    Patient Measurements: Height: 5\' 5"  (165.1 cm) (from 07/27/11 preadmit) Weight: 199 lb 4.7 oz (90.399 kg) (from 07/28/11 preadmit) IBW/kg (Calculated) : 57    Vital Signs: Temp: 98 F (36.7 C) (02/16 0601) BP: 127/73 mmHg (02/16 0601) Pulse Rate: 70  (02/16 0601)  Preadmisson labs 07/28/11 PTT 28, PT 13.4, INR 1.00 Hgb 12.5,  Hct 36.5, PLTC 322 WBC 6.1 Scr 0.69  Basename 08/07/11 0600 08/06/11 0700 08/05/11 0515  HGB 10.8* 10.6* --  HCT 32.0* 31.6* 31.0*  PLT 291 247 258  APTT -- -- --  LABPROT 14.5 14.7 14.2  INR 1.11 1.13 1.08  HEPARINUNFRC -- -- --  CREATININE 0.60 0.69 0.61  CKTOTAL -- -- --  CKMB -- -- --  TROPONINI -- -- --   Estimated Creatinine Clearance: 80 ml/min (by C-G formula based on Cr of 0.6).  Medical History: Past Medical History  Diagnosis Date  . PONV (postoperative nausea and vomiting)   . Hypothyroidism   . GERD (gastroesophageal reflux disease)   . Arthritis   . Anemia     Medications:  Prescriptions prior to admission  Medication Sig Dispense Refill  . Cholecalciferol (VITAMIN D-3) 5000 UNITS TABS Take 1 tablet by mouth daily.      . diazepam (VALIUM) 5 MG tablet Take 5 mg by mouth every 12 (twelve) hours as needed. For anxiety      . Eszopiclone (ESZOPICLONE) 3 MG TABS Take 3 mg by mouth at bedtime as needed. Take immediately before bedtime for sleep      . levothyroxine (SYNTHROID, LEVOTHROID) 125 MCG tablet Take 125 mcg by mouth daily.      . norethindrone-ethinyl estradiol (FEMHRT 1/5) 1-5 MG-MCG TABS Take 1 tablet by mouth daily.      . SUMAtriptan (IMITREX) 50 MG tablet Take 50 mg by mouth every 2 (two) hours as needed. For migrane headache      . DISCONTD: celecoxib (CELEBREX) 200 MG capsule Take 200 mg by mouth 2 (two) times daily.      Marland Kitchen DISCONTD: traMADol (ULTRAM) 50 MG tablet  Take 100 mg by mouth every 8 (eight) hours as needed. For pain       Scheduled:     . docusate sodium  100 mg Oral BID  . enoxaparin  30 mg Subcutaneous Q12H  . levothyroxine  125 mcg Oral Daily  . warfarin  7.5 mg Oral ONCE-1800  . warfarin  7.5 mg Oral ONCE-1800  . warfarin   Does not apply Once   Infusions:    PRN: acetaminophen, acetaminophen, diazepam, menthol-cetylpyridinium, methocarbamol(ROBAXIN) IV, methocarbamol, ondansetron, oxyCODONE-acetaminophen, phenol, SUMAtriptan Anti-infectives     Start     Dose/Rate Route Frequency Ordered Stop   08/04/11 2100   vancomycin (VANCOCIN) IVPB 1000 mg/200 mL premix        1,000 mg 200 mL/hr over 60 Minutes Intravenous Every 12 hours 08/04/11 1441 08/05/11 0948   08/04/11 0500   vancomycin (VANCOCIN) IVPB 1000 mg/200 mL premix        1,000 mg 200 mL/hr over 60 Minutes Intravenous 60 min pre-op 08/03/11 1514 08/04/11 0852          Assessment: 66 yo female s/p R. TKA POD#3 on Coumadin for VTE prophylaxis and bridge with Lovenox 30mg  sq q12h. Renal function stable, appropriate for lovenox dose. Today INR = 1.11 (Baseline INR =  1.0). H/H decreased post op, but stable. No bleeding reported per chart notes.   Of note - no Coumadin dose was charted 2/14.   Goal of Therapy:  INR 2-3   Plan:  Coumadin 10 mg po x 1, will give before discharge. INR daily.   Valerie Gentry C 08/07/2011,11:33 AM

## 2012-02-11 IMAGING — CR DG CHEST 2V
2 series · 2 of 2 positions shown · non-contrast
Comparison: PA and lateral chest 07/24/2010.

CLINICAL DATA: Preoperative films.

CHEST - 2 VIEW

[view not recorded (1 of 2)]
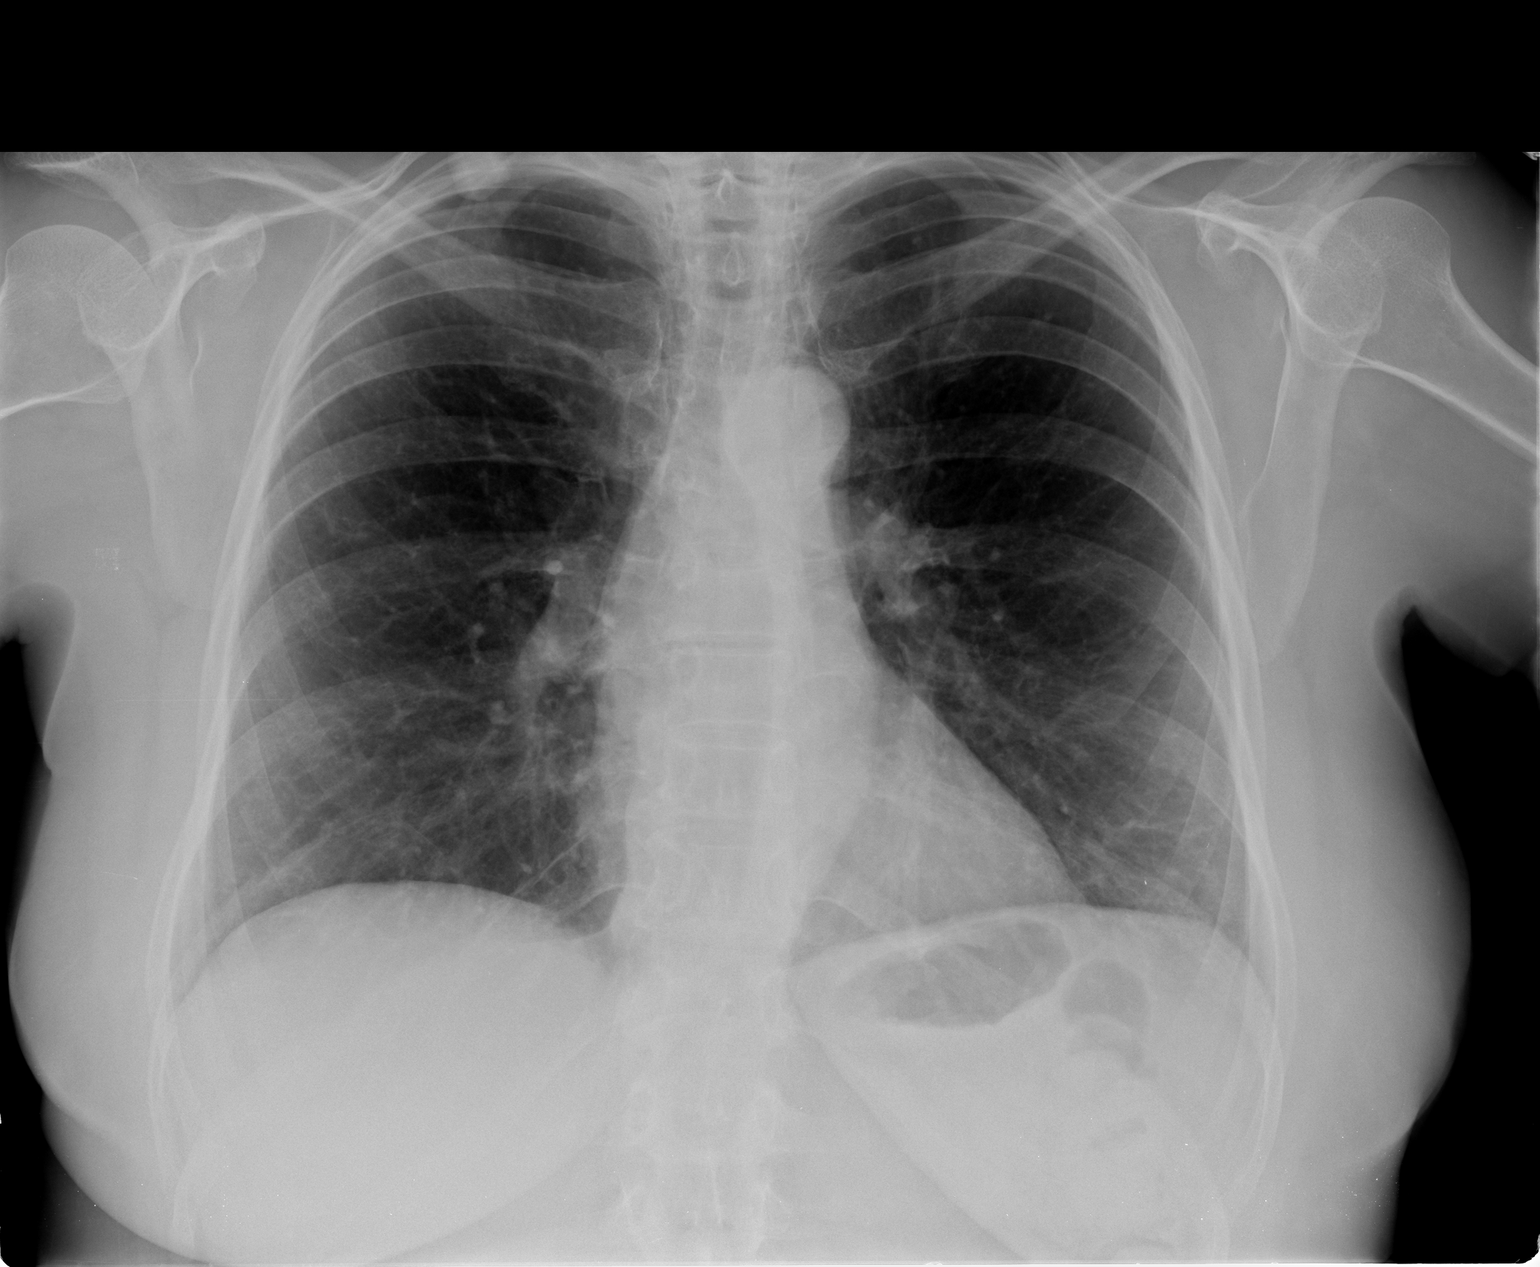

[view not recorded (2 of 2)]
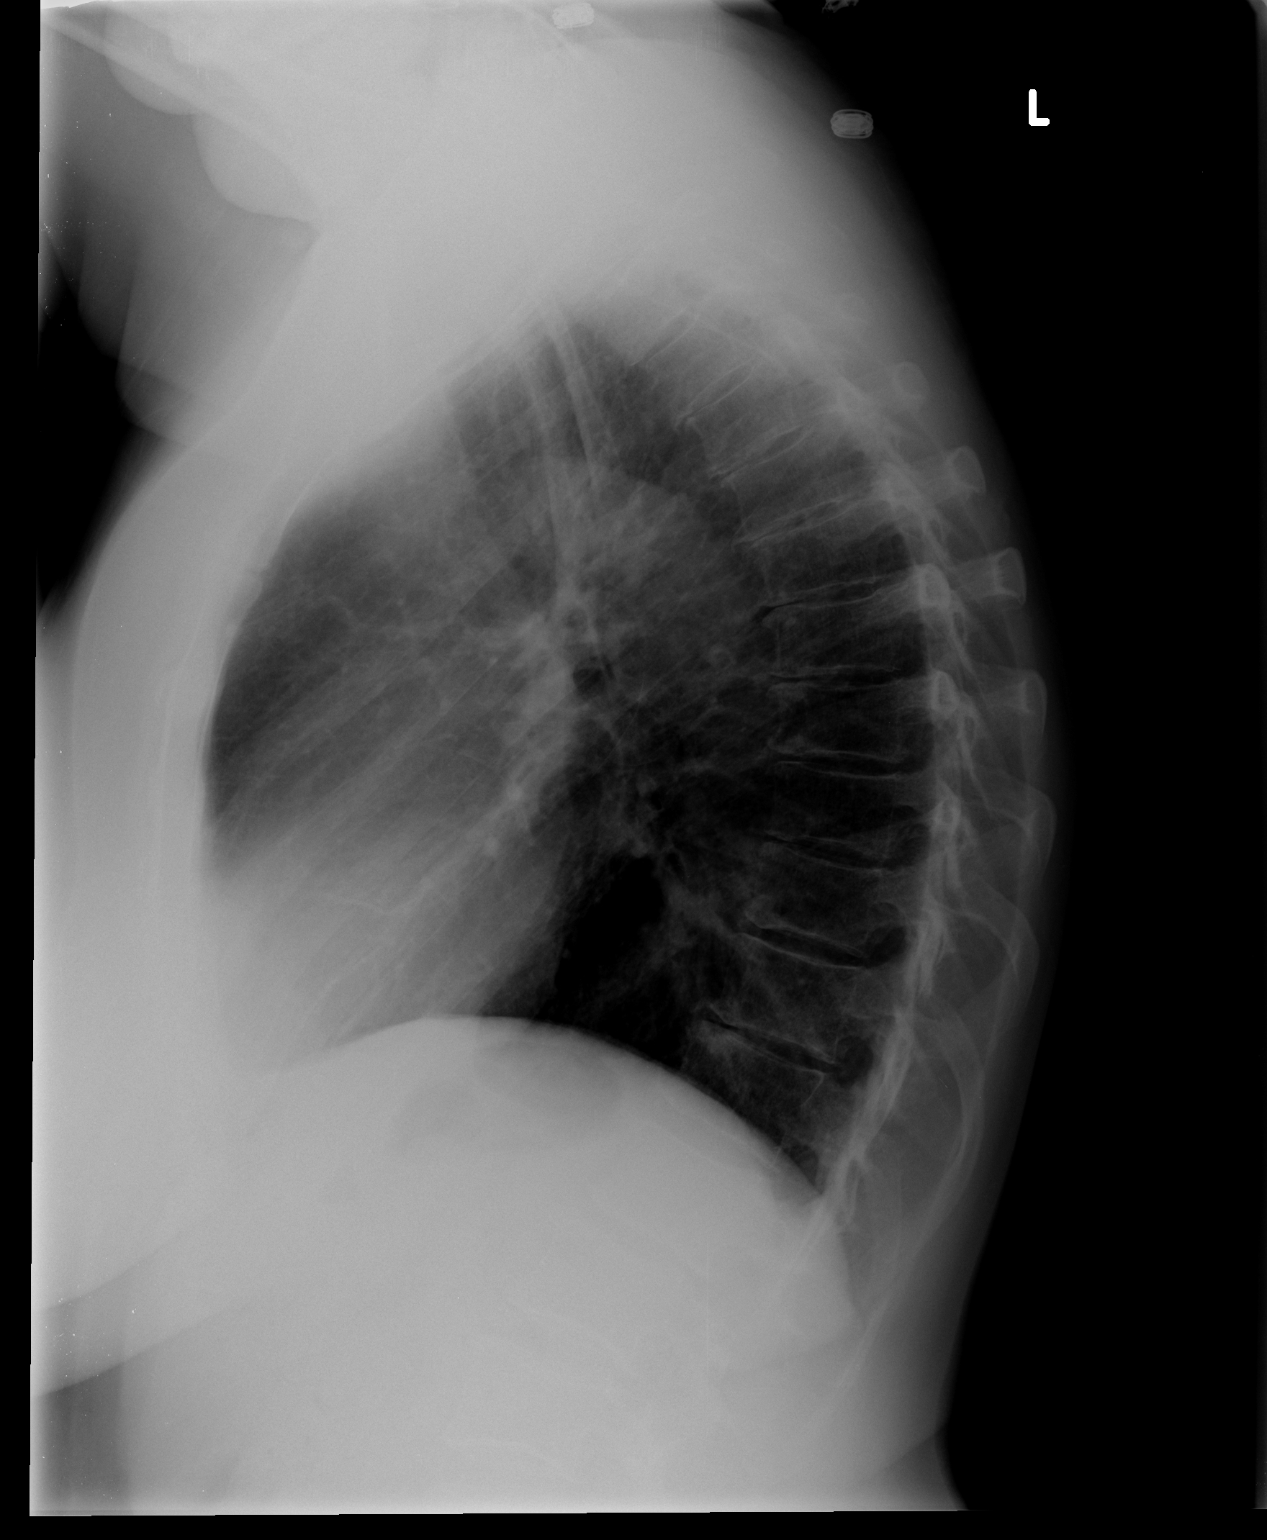

[2 of 2 positions shown; findings below may reference images not displayed]

FINDINGS: Lungs are clear.  Heart size is normal.  No pneumothorax
or pleural effusion.  No focal bony abnormality.
IMPRESSION: Negative chest.

## 2012-04-11 ENCOUNTER — Ambulatory Visit: Payer: Self-pay | Admitting: Ophthalmology

## 2012-04-17 ENCOUNTER — Ambulatory Visit: Payer: Self-pay | Admitting: Ophthalmology

## 2012-06-23 ENCOUNTER — Ambulatory Visit: Payer: Self-pay | Admitting: Family Medicine

## 2013-09-28 ENCOUNTER — Ambulatory Visit: Payer: Self-pay | Admitting: Family Medicine

## 2013-11-09 ENCOUNTER — Ambulatory Visit: Payer: Self-pay | Admitting: Unknown Physician Specialty

## 2014-04-14 IMAGING — MG MM DIGITAL SCREENING BILAT W/ CAD
1 series · 4 of 4 positions shown · non-contrast
Comparison: Previous exam(s).

CLINICAL DATA: Screening.

EXAM:
DIGITAL SCREENING BILATERAL MAMMOGRAM WITH CAD

[R CC · right · 4 of 4 slices shown]
[im 1/4]
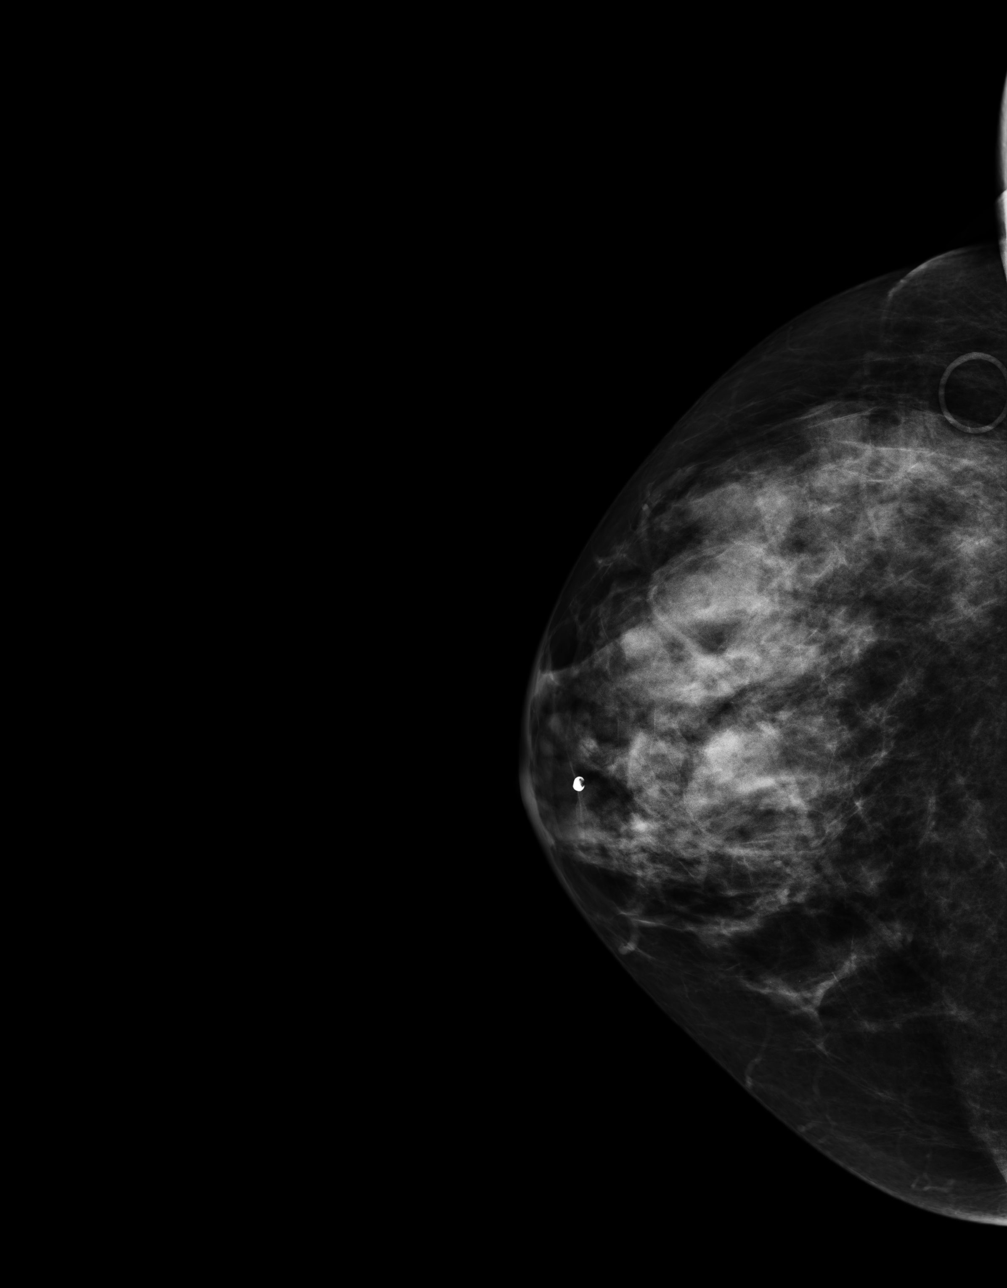
[im 2/4]
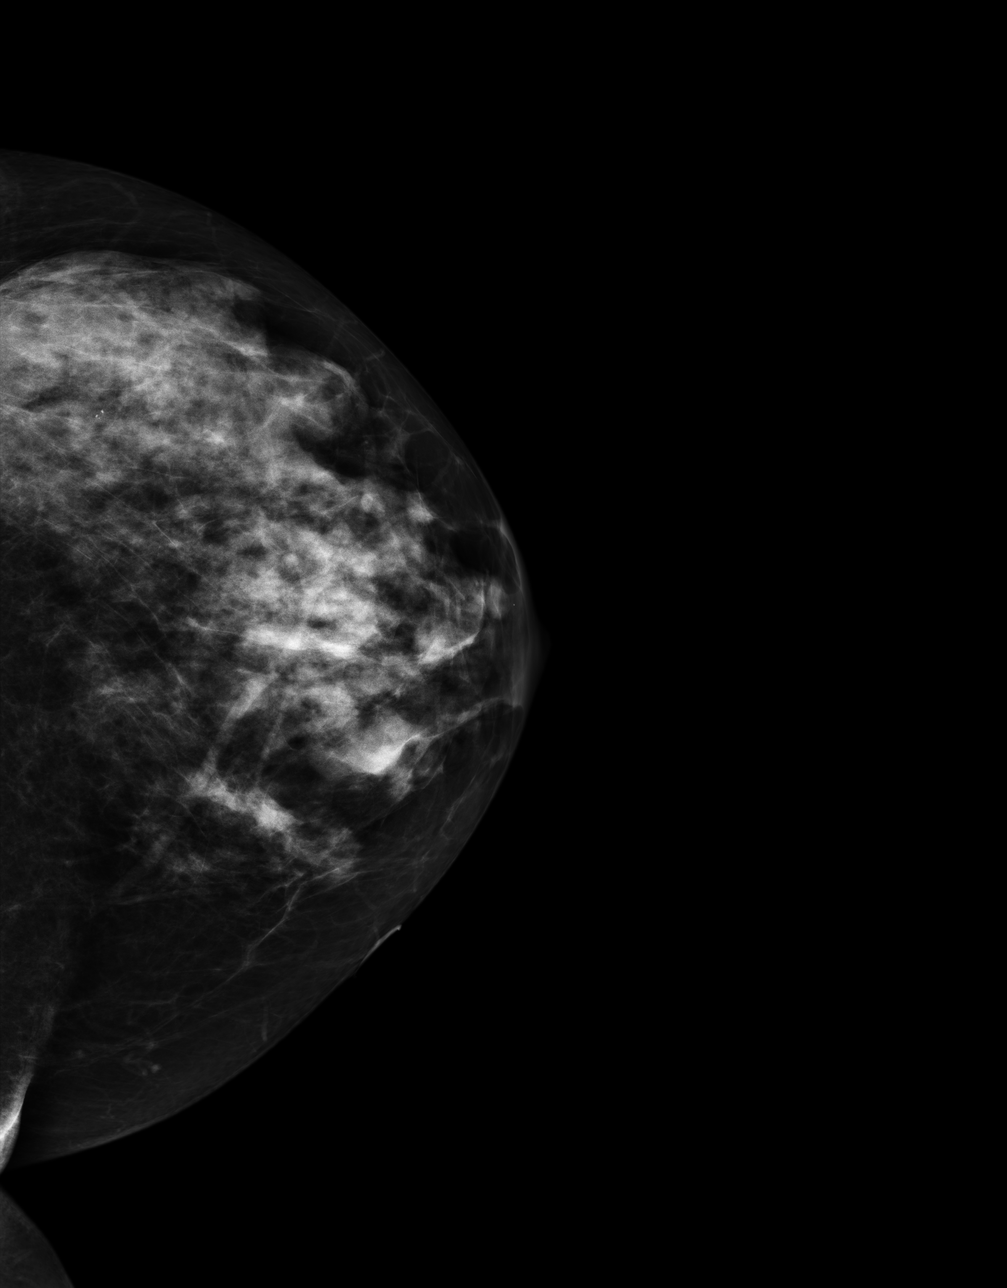
[im 3/4]
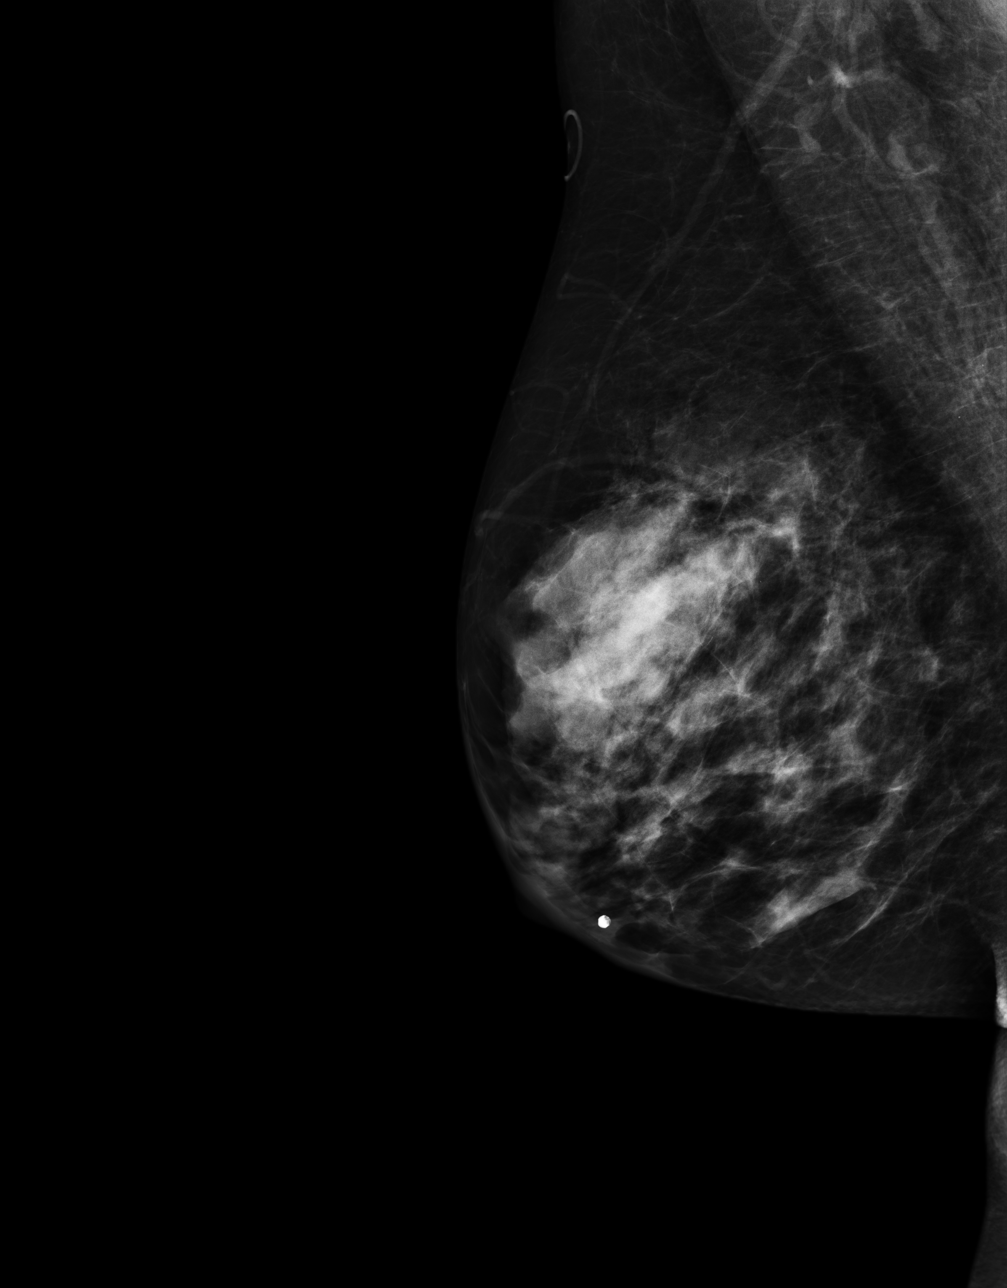
[im 4/4]
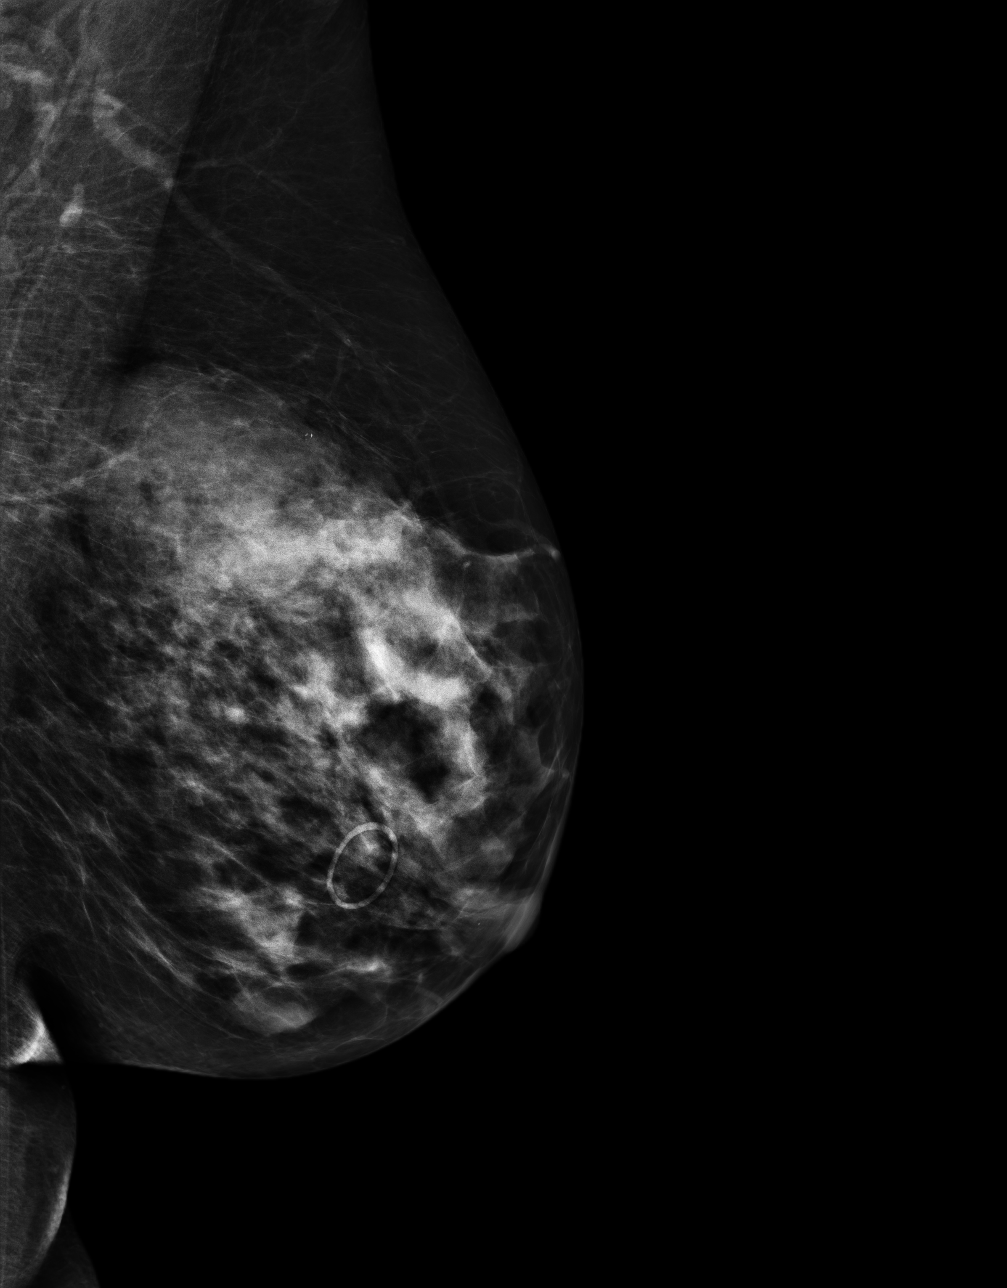

[4 of 4 positions shown; findings below may reference images not displayed]

ACR Breast Density Category c: The breast tissue is heterogeneously
dense, which may obscure small masses.
FINDINGS: There are no findings suspicious for malignancy. Images were
processed with CAD.
IMPRESSION: No mammographic evidence of malignancy. A result letter of this
screening mammogram will be mailed directly to the patient.

RECOMMENDATION:
Screening mammogram in one year. (Code:YJ-2-FEZ)

BI-RADS CATEGORY  1: Negative.

## 2014-04-18 ENCOUNTER — Ambulatory Visit: Payer: Self-pay | Admitting: Physical Medicine and Rehabilitation

## 2014-10-08 NOTE — Op Note (Signed)
PATIENT NAME:  Valerie Gentry, Valerie Gentry MR#:  [id number] DATE OF BIRTH:  14-Nov-1945  DATE OF PROCEDURE:  04/17/2012  PREOPERATIVE DIAGNOSIS:  Cataract, left eye.    POSTOPERATIVE DIAGNOSIS:  Cataract, left eye.  PROCEDURE PERFORMED:  Extracapsular cataract extraction using phacoemulsification with placement of an Alcon SN6CWS, 20.0-diopter posterior chamber lens, serial # [id number].  SURGEON:  Loura Back. Jabron Weese, MD  ASSISTANT:  None.  ANESTHESIA:  4% lidocaine and 0.75% Marcaine in Gentry 50/50 mixture with 10 units/mL of Hylenex added, given as peribulbar.  ANESTHESIOLOGIST:  Dr. Benjamine Mola   COMPLICATIONS:  None.  ESTIMATED BLOOD LOSS:  Less than 1 mL.  DESCRIPTION OF PROCEDURE:  The patient was brought to the operating room and given Gentry peribulbar block.  The patient was then prepped and draped in the usual fashion.  The vertical rectus muscles were imbricated using 5-0 silk sutures.  These sutures were then clamped to the sterile drapes as bridle sutures.  Gentry limbal peritomy was performed extending two clock hours and hemostasis was obtained with cautery.  Gentry partial thickness scleral groove was made at the surgical limbus and dissected anteriorly in Gentry lamellar dissection using an Alcon crescent knife.  The anterior chamber was entered supero-temporally with Gentry Superblade and through the lamellar dissection with Gentry 2.6 mm keratome.  DisCoVisc was used to replace the aqueous and Gentry continuous tear capsulorrhexis was carried out.  Hydrodissection and hydrodelineation were carried out with balanced salt and Gentry 27 gauge canula.  The nucleus was rotated to confirm the effectiveness of the hydrodissection.  Phacoemulsification was carried out using Gentry divide-and-conquer technique.  Total ultrasound time was 1 minute and 49 seconds with an average power of 15.7 percent, CDE 30.46.  Irrigation/aspiration was used to remove the residual cortex.  DisCoVisc was used to inflate the capsule and the internal incision was  enlarged to 3 mm with the crescent knife.  The intraocular lens was folded and inserted into the capsular bag using the AcrySert delivery system.  Irrigation/aspiration was used to remove the residual DisCoVisc.  Miostat was injected into the anterior chamber through the paracentesis track to inflate the anterior chamber and induce miosis.  The wound was checked for leaks and none were found. The conjunctiva was closed with cautery and the bridle sutures were removed.  Two drops of 0.3% Vigamox were placed on the eye.   An eye shield was placed on the eye.  The patient was discharged to the recovery room in good condition.  ____________________________ Loura Back Aryona Sill, MD sad:drc D: 04/17/2012 13:45:29 ET T: 04/17/2012 13:55:46 ET JOB#: [id number]  cc: Remo Lipps Gentry. Devery Murgia, MD, <Dictator> Martie Lee MD ELECTRONICALLY SIGNED 04/19/2012 14:59

## 2015-03-13 ENCOUNTER — Other Ambulatory Visit: Payer: Self-pay | Admitting: Family Medicine

## 2015-03-13 DIAGNOSIS — Z1231 Encounter for screening mammogram for malignant neoplasm of breast: Secondary | ICD-10-CM

## 2015-03-20 ENCOUNTER — Ambulatory Visit: Payer: Self-pay | Attending: Family Medicine

## 2015-06-19 ENCOUNTER — Ambulatory Visit
Admission: RE | Admit: 2015-06-19 | Discharge: 2015-06-19 | Disposition: A | Discharge status: Home / Self Care | Payer: 59 | Source: Ambulatory Visit | Attending: Family Medicine | Admitting: Family Medicine

## 2015-06-19 DIAGNOSIS — Z1231 Encounter for screening mammogram for malignant neoplasm of breast: Secondary | ICD-10-CM | POA: Insufficient documentation

## 2015-06-26 ENCOUNTER — Other Ambulatory Visit: Payer: Self-pay | Admitting: Family Medicine

## 2015-06-26 DIAGNOSIS — R928 Other abnormal and inconclusive findings on diagnostic imaging of breast: Secondary | ICD-10-CM

## 2015-07-23 ENCOUNTER — Ambulatory Visit: Payer: 59

## 2015-07-23 ENCOUNTER — Other Ambulatory Visit: Payer: 59

## 2015-08-04 ENCOUNTER — Ambulatory Visit
Admission: RE | Admit: 2015-08-04 | Discharge: 2015-08-04 | Disposition: A | Discharge status: Home / Self Care | Payer: Medicare Other | Source: Ambulatory Visit | Attending: Family Medicine | Admitting: Family Medicine

## 2015-08-04 DIAGNOSIS — R928 Other abnormal and inconclusive findings on diagnostic imaging of breast: Secondary | ICD-10-CM | POA: Diagnosis present

## 2016-07-08 ENCOUNTER — Other Ambulatory Visit: Payer: Self-pay | Admitting: Family Medicine
